# Patient Record
Sex: Male | Born: 1945 | Race: White | Hispanic: No | Smoking: Never smoker
Health system: Southern US, Community
[De-identification: ages and names within clinical notes are randomized; demographics above are authoritative.]

## PROBLEM LIST (undated history)

## (undated) DIAGNOSIS — R3915 Urgency of urination: Secondary | ICD-10-CM

## (undated) DIAGNOSIS — M549 Dorsalgia, unspecified: Secondary | ICD-10-CM

## (undated) DIAGNOSIS — G20 Parkinson's disease: Secondary | ICD-10-CM

## (undated) DIAGNOSIS — I10 Essential (primary) hypertension: Secondary | ICD-10-CM

## (undated) DIAGNOSIS — E119 Type 2 diabetes mellitus without complications: Secondary | ICD-10-CM

## (undated) DIAGNOSIS — M2040 Other hammer toe(s) (acquired), unspecified foot: Secondary | ICD-10-CM

## (undated) DIAGNOSIS — N200 Calculus of kidney: Secondary | ICD-10-CM

## (undated) DIAGNOSIS — G20A1 Parkinson's disease without dyskinesia, without mention of fluctuations: Secondary | ICD-10-CM

## (undated) HISTORY — DX: Essential (primary) hypertension: I10

## (undated) HISTORY — DX: Type 2 diabetes mellitus without complications: E11.9

## (undated) HISTORY — DX: Parkinson's disease: G20

## (undated) HISTORY — DX: Other hammer toe(s) (acquired), unspecified foot: M20.40

## (undated) HISTORY — DX: Dorsalgia, unspecified: M54.9

## (undated) HISTORY — DX: Urgency of urination: R39.15

## (undated) HISTORY — PX: TOE SURGERY: SHX1073

## (undated) HISTORY — DX: Parkinson's disease without dyskinesia, without mention of fluctuations: G20.A1

---

## 2000-02-08 ENCOUNTER — Encounter (INDEPENDENT_AMBULATORY_CARE_PROVIDER_SITE_OTHER): Payer: Self-pay | Admitting: Specialist

## 2000-02-08 ENCOUNTER — Ambulatory Visit (HOSPITAL_COMMUNITY): Admission: RE | Admit: 2000-02-08 | Discharge: 2000-02-08 | Payer: Self-pay | Admitting: Gastroenterology

## 2000-09-29 ENCOUNTER — Encounter: Admission: RE | Admit: 2000-09-29 | Discharge: 2000-12-28 | Payer: Self-pay | Admitting: Family Medicine

## 2001-01-15 ENCOUNTER — Encounter: Admission: RE | Admit: 2001-01-15 | Discharge: 2001-04-15 | Payer: Self-pay | Admitting: Family Medicine

## 2005-09-03 ENCOUNTER — Encounter: Admission: RE | Admit: 2005-09-03 | Discharge: 2005-09-03 | Payer: Self-pay | Admitting: Family Medicine

## 2009-04-14 ENCOUNTER — Emergency Department (HOSPITAL_COMMUNITY): Admission: EM | Admit: 2009-04-14 | Discharge: 2009-04-14 | Payer: Self-pay | Admitting: Emergency Medicine

## 2010-10-01 ENCOUNTER — Encounter
Admission: RE | Admit: 2010-10-01 | Discharge: 2010-10-01 | Payer: Self-pay | Source: Home / Self Care | Attending: Family Medicine | Admitting: Family Medicine

## 2011-01-28 LAB — DIFFERENTIAL
Basophils Absolute: 0 10*3/uL (ref 0.0–0.1)
Basophils Relative: 0 % (ref 0–1)
Eosinophils Absolute: 0.1 10*3/uL (ref 0.0–0.7)
Eosinophils Relative: 1 % (ref 0–5)
Lymphocytes Relative: 15 % (ref 12–46)
Lymphs Abs: 1.3 10*3/uL (ref 0.7–4.0)
Monocytes Absolute: 0.7 10*3/uL (ref 0.1–1.0)
Monocytes Relative: 8 % (ref 3–12)
Neutro Abs: 6.9 10*3/uL (ref 1.7–7.7)
Neutrophils Relative %: 76 % (ref 43–77)

## 2011-01-28 LAB — COMPREHENSIVE METABOLIC PANEL
ALT: <8 U/L (ref 0–53)
AST: 28 U/L (ref 0–37)
Albumin: 4.3 g/dL (ref 3.5–5.2)
Alkaline Phosphatase: 52 U/L (ref 39–117)
BUN: 22 mg/dL (ref 6–23)
CO2: 27 mEq/L (ref 19–32)
Calcium: 9.5 mg/dL (ref 8.4–10.5)
Chloride: 102 mEq/L (ref 96–112)
Creatinine, Ser: 0.93 mg/dL (ref 0.4–1.5)
GFR calc Af Amer: >60 mL/min (ref >60)
GFR calc non Af Amer: >60 mL/min (ref >60)
Glucose, Bld: 121 mg/dL — ABNORMAL HIGH (ref 70–99)
Potassium: 3.8 mEq/L (ref 3.5–5.1)
Sodium: 138 mEq/L (ref 135–145)
Total Bilirubin: 1.2 mg/dL (ref 0.3–1.2)
Total Protein: 7.1 g/dL (ref 6.0–8.3)

## 2011-01-28 LAB — CBC
HCT: 42.2 % (ref 39.0–52.0)
Hemoglobin: 14.8 g/dL (ref 13.0–17.0)
MCHC: 35 g/dL (ref 30.0–36.0)
MCV: 89.3 fL (ref 78.0–100.0)
Platelets: 185 10*3/uL (ref 150–400)
RBC: 4.73 MIL/uL (ref 4.22–5.81)
RDW: 13.2 % (ref 11.5–15.5)
WBC: 9.1 10*3/uL (ref 4.0–10.5)

## 2011-03-08 NOTE — Procedures (Signed)
[place]. [facility]  Patient:    Cody [name], CLASS                       MRN: [id number] Proc. Date: 02/08/00 Attending:  Florencia Reasons, M.D. CC:         Jethro Bastos, M.D.                           Procedure Report  PROCEDURE:  Colonoscopy with biopsies.  INDICATION:  A 65 year old gentleman with recent onset small volume hematochezia and nondiagnostic flexible sigmoidoscopy in the office.  FINDINGS:  Segmental colitis near the splenic flexure consistent with ischemia,  mild to moderate severity.  DESCRIPTION OF PROCEDURE:  The nature, purpose, and risks of the procedure had een discussed with the patient who provided written consent.  Sedation was fentanyl 100 mcg and Versed 10 mg IV without arrhythmias or desaturation.  The Olympus adjustable tension adult video colonoscope was advanced easily to the terminal ileum which had a normal appearance and pull back was then performed.  The only abnormality identified on this exam was a 10 cm segment of inflamed colonic mucosa near the splenic flexure extending from about 40 cm to 50 cm, as  measured during pull back.  This area had the characteristic appearance of ischemic colitis, characterized by semicircumferential and circumferential areas of inflammatory changes, including erythema, edema, and exudate.  The tissue appeared viable, not at all gangrenous, or frankly necrotic.  At the fringes of inflamed segment, the changes were more mild and almost linear in extent as opposed to circumferential where it is in the middle of the segment.  They were more pronounced and more circumferential, again consistent with ischemic colitis.  The inflamed areas were biopsied.  Otherwise this was a normal exam.  I did not see any polyps, cancer, vascular malformations, or diverticular disease.  The patient tolerated this procedure well and there were no apparent complications.  IMPRESSION:   Short segment of probable ischemic colitis, mild to moderate severity, pathology pending.  PLAN:  Await pathology on todays biopsies.  Since the patients bleeding has stopped and he is fully ambulatory and feels quite well, there is no need for inpatient management.  This can be followed expectantly. DD:  02/08/00 TD:  02/10/00 Job: [id number]

## 2013-01-18 ENCOUNTER — Telehealth: Payer: Self-pay | Admitting: *Deleted

## 2013-01-18 NOTE — Telephone Encounter (Signed)
Patient wants sooner appointment.

## 2013-01-19 ENCOUNTER — Encounter: Payer: Self-pay | Admitting: *Deleted

## 2013-01-21 NOTE — Telephone Encounter (Signed)
Patient coming for a sooner apt to see Dr.Yan 01/22/2013

## 2013-01-21 NOTE — Telephone Encounter (Signed)
error 

## 2013-01-22 ENCOUNTER — Ambulatory Visit (INDEPENDENT_AMBULATORY_CARE_PROVIDER_SITE_OTHER): Payer: Medicare Other | Admitting: Neurology

## 2013-01-22 ENCOUNTER — Encounter: Payer: Self-pay | Admitting: Neurology

## 2013-01-22 VITALS — BP 120/68 | HR 62 | Ht 71.0 in | Wt 218.0 lb

## 2013-01-22 DIAGNOSIS — M549 Dorsalgia, unspecified: Secondary | ICD-10-CM | POA: Insufficient documentation

## 2013-01-22 DIAGNOSIS — E119 Type 2 diabetes mellitus without complications: Secondary | ICD-10-CM

## 2013-01-22 DIAGNOSIS — M2040 Other hammer toe(s) (acquired), unspecified foot: Secondary | ICD-10-CM | POA: Insufficient documentation

## 2013-01-22 DIAGNOSIS — I10 Essential (primary) hypertension: Secondary | ICD-10-CM

## 2013-01-22 DIAGNOSIS — R3915 Urgency of urination: Secondary | ICD-10-CM

## 2013-01-22 DIAGNOSIS — M2042 Other hammer toe(s) (acquired), left foot: Secondary | ICD-10-CM

## 2013-01-22 DIAGNOSIS — G20 Parkinson's disease: Secondary | ICD-10-CM

## 2013-01-22 MED ORDER — ROPINIROLE HCL ER 4 MG PO TB24: 12.0000 mg | ORAL_TABLET | Freq: Every day | ORAL | Status: DC

## 2013-01-22 MED ORDER — CARBIDOPA-LEVODOPA-ENTACAPONE 50-200-200 MG PO TABS: 1.0000 | ORAL_TABLET | Freq: Four times a day (QID) | ORAL | Status: DC

## 2013-01-22 MED ORDER — RASAGILINE MESYLATE 1 MG PO TABS: 1.0000 mg | ORAL_TABLET | Freq: Every day | ORAL | Status: DC

## 2013-01-22 NOTE — Progress Notes (Signed)
Cody [name] is a 67 year old gentleman with Parkinson's disease, he was followed at [facility] since November 2005, by Dr. Orlin Hilding  for Parkinson's disease.   He has past medical history of diabetes, hypertension, prostate hypertrophy, depression/anxiety.   The initial symptoms was in 2003, he noticed dragging his left leg, while ambulating. Couple years later, he noticed a left hand resting tremor, stiffness of left upper extremity. He was diagnosed since 2005   Since seen by Dr. Orlin Hilding,  he was given Requip, which is causing sleepiness, has been on Stalevo , also taking amantadine 100 mg at 6:30 AM, 2:30 PM.  He described over all the meds works well,   He is currently living alone, driving, independent living, taking Stalevo 204 times a day, and 6, 10, 1400, 1800 every day, he gets up at 5:00, go to bed around 10 PM, sleeping in recliner sometimes, because of difficulty turning at that time, he is also taking Requip generic,4 mg  instead of 3 times a day, he is only taking one tablet every night. He is also taking Azilect 1 mg every night tolerating the medicine well, no significant side effect,   He reported wearing off today, 15-30 minutes before the next dose of Stalevo, he noticed stiffness, difficulty moving, bradykinesia, resolved in 30-60 minutes after he took next dose of medication, no significant side effect,  He was able to exercise regularly,  Review of Systems  Out of a complete 14 system review, the patient complains of only the following symptoms, and all other reviewed systems are negative.   Constitutional:   Fatigue Cardiovascular:  N/A Ear/Nose/Throat:  N/A Skin: N/A Eyes: N/A Respiratory: N/A Gastroitestinal: N/A    Hematology/Lymphatic:  Easy bleeding Endocrine:  N/A Musculoskeletal:N/A Allergy/Immunology: N/A Neurological: Weakness, restless leg  Psychiatric:    Depression, not enough sleep, change in appetite,   Physical Exam  General:  mild masking of face,  microphonia. Head: head  normocephalic and atraumatic.  Oropharynx benign. Neck: supple with no carotid or supraclavicular bruits. Restriction right and left Respiratory: LCA Cardiovascular: regular rate and rhythm, no murmurs  Neurologic Exam  Mental Status: Awake and fully alert. F  Mood and affect appropriate. Cranial Nerves: Pupils equal, briskly reactive to light.  Extraocular movements full without nystagmus.  Visual fields full to confrontation. Decreased blink noted.  Hearing intact and symmetric to finger snap.  Facial sensation intact.  Face, tongue, palate move normally and symmetrically.  Neck flexion and extension normal. Myerson sign neg.  Motor:  Normal strength in all tested extremity muscles. Moderate cogwheeling of limb and nuchal, bradykinesia of bilateral upper and lower extremity Sensory: normal to light touch. Coordination: no dysmetria, Finger-to-nose and heel-to-shin performed accurately bilaterally. Gait and Station: Arises from chair without use of hands, no difficulty.  Stance is narrow based.  mildly decreased arm swings, left worse than right mild difficulty turning, limp, scoliosis Reflexes: 2+ and symmetric.  Toes downgoing.   Assessment and Plan: 67 years old Caucasian male with past medical history of hypertension diabetes, with idiopathic Parkinson's disease,  Continue Stalevo at current dose, at 6, 10, 1400, 1800, Requip xr 4mg  titrating to 12mg  qhas Azilect 1mg  po qday  Moderate exercise, return to clinic in 4 months, if he continued to have wearing off phenomenon, I would increase his Stalevo to 5 tablets each day

## 2013-02-04 ENCOUNTER — Ambulatory Visit: Payer: Self-pay | Admitting: Nurse Practitioner

## 2013-02-23 ENCOUNTER — Telehealth: Payer: Self-pay | Admitting: *Deleted

## 2013-02-23 NOTE — Telephone Encounter (Signed)
R/S time 2:30 per patient

## 2013-03-01 ENCOUNTER — Telehealth: Payer: Self-pay | Admitting: *Deleted

## 2013-03-01 NOTE — Telephone Encounter (Signed)
Left message for patient appointment. 

## 2013-05-25 ENCOUNTER — Ambulatory Visit: Payer: Medicare Other | Admitting: Neurology

## 2013-06-09 ENCOUNTER — Ambulatory Visit: Payer: Self-pay | Admitting: Neurology

## 2013-06-09 ENCOUNTER — Ambulatory Visit: Payer: Medicare Other | Admitting: Neurology

## 2013-06-28 ENCOUNTER — Ambulatory Visit: Payer: Self-pay | Admitting: Neurology

## 2014-01-02 ENCOUNTER — Inpatient Hospital Stay (HOSPITAL_BASED_OUTPATIENT_CLINIC_OR_DEPARTMENT_OTHER)
Admission: EM | Admit: 2014-01-02 | Discharge: 2014-01-05 | DRG: 194 | Disposition: A | Discharge status: Home Health | Payer: Medicare HMO | Attending: Internal Medicine | Admitting: Internal Medicine

## 2014-01-02 ENCOUNTER — Emergency Department (HOSPITAL_BASED_OUTPATIENT_CLINIC_OR_DEPARTMENT_OTHER): Payer: Medicare HMO

## 2014-01-02 ENCOUNTER — Encounter (HOSPITAL_BASED_OUTPATIENT_CLINIC_OR_DEPARTMENT_OTHER): Payer: Self-pay | Admitting: Emergency Medicine

## 2014-01-02 DIAGNOSIS — J189 Pneumonia, unspecified organism: Principal | ICD-10-CM | POA: Diagnosis present

## 2014-01-02 DIAGNOSIS — E119 Type 2 diabetes mellitus without complications: Secondary | ICD-10-CM | POA: Diagnosis present

## 2014-01-02 DIAGNOSIS — B9689 Other specified bacterial agents as the cause of diseases classified elsewhere: Secondary | ICD-10-CM | POA: Diagnosis not present

## 2014-01-02 DIAGNOSIS — I959 Hypotension, unspecified: Secondary | ICD-10-CM | POA: Diagnosis present

## 2014-01-02 DIAGNOSIS — I10 Essential (primary) hypertension: Secondary | ICD-10-CM | POA: Diagnosis present

## 2014-01-02 DIAGNOSIS — Z888 Allergy status to other drugs, medicaments and biological substances status: Secondary | ICD-10-CM

## 2014-01-02 DIAGNOSIS — N390 Urinary tract infection, site not specified: Secondary | ICD-10-CM | POA: Diagnosis present

## 2014-01-02 DIAGNOSIS — G20 Parkinson's disease: Secondary | ICD-10-CM | POA: Diagnosis present

## 2014-01-02 DIAGNOSIS — R7881 Bacteremia: Secondary | ICD-10-CM | POA: Diagnosis not present

## 2014-01-02 DIAGNOSIS — Z881 Allergy status to other antibiotic agents status: Secondary | ICD-10-CM

## 2014-01-02 DIAGNOSIS — E871 Hypo-osmolality and hyponatremia: Secondary | ICD-10-CM | POA: Diagnosis not present

## 2014-01-02 DIAGNOSIS — G20A1 Parkinson's disease without dyskinesia, without mention of fluctuations: Secondary | ICD-10-CM | POA: Diagnosis present

## 2014-01-02 DIAGNOSIS — Z833 Family history of diabetes mellitus: Secondary | ICD-10-CM

## 2014-01-02 DIAGNOSIS — Z823 Family history of stroke: Secondary | ICD-10-CM

## 2014-01-02 DIAGNOSIS — M549 Dorsalgia, unspecified: Secondary | ICD-10-CM

## 2014-01-02 LAB — BASIC METABOLIC PANEL
BUN: 26 mg/dL — ABNORMAL HIGH (ref 6–23)
CO2: 24 mEq/L (ref 19–32)
Calcium: 9.6 mg/dL (ref 8.4–10.5)
Chloride: 94 mEq/L — ABNORMAL LOW (ref 96–112)
Creatinine, Ser: 1.1 mg/dL (ref 0.50–1.35)
GFR calc Af Amer: 78 mL/min — ABNORMAL LOW (ref >90)
GFR calc non Af Amer: 67 mL/min — ABNORMAL LOW (ref >90)
Glucose, Bld: 224 mg/dL — ABNORMAL HIGH (ref 70–99)
Potassium: 4 mEq/L (ref 3.7–5.3)
Sodium: 132 mEq/L — ABNORMAL LOW (ref 137–147)

## 2014-01-02 LAB — CBC
HCT: 40.4 % (ref 39.0–52.0)
Hemoglobin: 14.1 g/dL (ref 13.0–17.0)
MCH: 30.7 pg (ref 26.0–34.0)
MCHC: 34.9 g/dL (ref 30.0–36.0)
MCV: 88 fL (ref 78.0–100.0)
Platelets: 167 10*3/uL (ref 150–400)
RBC: 4.59 MIL/uL (ref 4.22–5.81)
RDW: 13.3 % (ref 11.5–15.5)
WBC: 14.8 10*3/uL — ABNORMAL HIGH (ref 4.0–10.5)

## 2014-01-02 MED ORDER — DEXTROSE 5 % IV SOLN
500.0000 mg | Freq: Once | INTRAVENOUS | Status: AC
  Administered 2014-01-03: 500 mg via INTRAVENOUS

## 2014-01-02 MED ORDER — DEXTROSE 5 % IV SOLN
1.0000 g | Freq: Once | INTRAVENOUS | Status: AC
  Administered 2014-01-02: 1 g via INTRAVENOUS

## 2014-01-02 MED ORDER — SODIUM CHLORIDE 0.9 % IV BOLUS (SEPSIS)
500.0000 mL | Freq: Once | INTRAVENOUS | Status: AC
  Administered 2014-01-02: 500 mL via INTRAVENOUS

## 2014-01-02 MED ORDER — CEFTRIAXONE SODIUM 1 G IJ SOLR
INTRAMUSCULAR | Status: AC
  Filled 2014-01-02: qty 10

## 2014-01-02 MED ORDER — SODIUM CHLORIDE 0.9 % IV BOLUS (SEPSIS): 1000.0000 mL | Freq: Once | INTRAVENOUS | Status: DC

## 2014-01-02 NOTE — ED Provider Notes (Addendum)
CSN: [id number]     Arrival date & time 01/02/14  2057 History   First MD Initiated Contact with Patient 01/02/14 2132     This chart was scribed for Dagmar Hait, MD by Arlan Organ, ED Scribe. This patient was seen in room MH04/MH04 and the patient's care was started 9:52 PM.   Chief Complaint  Patient presents with  . Tremors  . Shoulder Pain   HPI Comments: Worsening Parkinsonism - no missed meds or med changes  Patient is a 68 y.o. male presenting with weakness. The history is provided by the patient. No language interpreter was used.  Weakness This is a new problem. The current episode started 2 days ago. The problem occurs constantly. The problem has been gradually worsening. Pertinent negatives include no chest pain. Nothing aggravates the symptoms. Nothing relieves the symptoms.    HPI Comments:  is a 68 y.o. male with a PMHx of Parkinson's Disease and DM who presents to the Emergency Department complaining of tremors x 3 days that is progressively worsening. He reports a "stiff" feeling to all extremities, urinary incontinence, and 1 episode of vomiting. Pt reports a reading of 140-160 blood sugar today. Denies changing any medications, and states he has been taking his prescribed medications as directed. At this time he denies any fever, CP, trouble breathing, chills, diarrhea, leg pain, or abdominal pain. He denies any recent falls. No other concerns at this time.   Past Medical History  Diagnosis Date  . Back pain     lumbar  . Urgency of urination   . Hammer toe   . Diabetes mellitus   . Unspecified essential hypertension   . Parkinson disease    Past Surgical History  Procedure Laterality Date  . Toe surgery Right     big toe  . Toe surgery Left     2nd toe   Family History  Problem Relation Age of Onset  . High blood pressure Mother   . Diabetes Mother   . Stroke Father    History  Substance Use Topics  . Smoking status: Never Smoker   . Smokeless  tobacco: Not on file  . Alcohol Use: No    Review of Systems  Constitutional: Negative for fever and chills.  HENT: Negative for congestion.   Eyes: Negative for redness.  Respiratory: Negative for cough.   Cardiovascular: Negative for chest pain.  Gastrointestinal: Positive for vomiting. Negative for nausea and diarrhea.  Musculoskeletal: Positive for myalgias.  Skin: Negative for rash.  Neurological: Positive for tremors and weakness.  Psychiatric/Behavioral: Negative for confusion.      Allergies  Amoxapine and related and Amoxicillin  Home Medications   Current Outpatient Rx  Name  Route  Sig  Dispense  Refill  . atenolol (TENORMIN) 25 MG tablet   Oral   Take 25 mg by mouth daily.         . carbidopa-levodopa-entacapone (STALEVO) 50-200-200 MG per tablet   Oral   Take 1 tablet by mouth 4 (four) times daily. At 6, 10, 1400, 1800 pm   130 tablet   6   . lisinopril-hydrochlorothiazide (PRINZIDE,ZESTORETIC) 10-12.5 MG per tablet   Oral   Take 1 tablet by mouth daily.         . metFORMIN (GLUCOPHAGE) 500 MG tablet   Oral   Take 500 mg by mouth 2 (two) times daily with a meal.         . rasagiline (AZILECT) 1 MG TABS  Oral   Take 1 tablet (1 mg total) by mouth daily.   30 tablet   6   . rOPINIRole (REQUIP XL) 4 MG 24 hr tablet   Oral   Take 3 tablets (12 mg total) by mouth at bedtime.   90 tablet   6   . sertraline (ZOLOFT) 100 MG tablet   Oral   Take 100 mg by mouth daily.           Triage Vitals: BP 104/60  Pulse 100  Temp(Src) 98.1 F (36.7 C) (Oral)  Resp 24  Ht 5\' 10"  (1.778 m)  Wt 221 lb (100.245 kg)  BMI 31.71 kg/m2  SpO2 95%   Physical Exam  Nursing note and vitals reviewed. Constitutional: He is oriented to person, place, and time. He appears well-developed and well-nourished.  HENT:  Head: Normocephalic and atraumatic.  Eyes: EOM are normal.  Neck: Normal range of motion.  Cardiovascular: Normal rate, regular rhythm,  normal heart sounds and intact distal pulses.   Pulmonary/Chest: Effort normal and breath sounds normal. No respiratory distress.  Abdominal: Soft. He exhibits no distension. There is no tenderness.  Musculoskeletal: Normal range of motion.  Mild cogwheel rigidity of right shoulder  Neurological: He is alert and oriented to person, place, and time.  Skin: Skin is warm and dry.  Psychiatric: He has a normal mood and affect. Judgment normal.    ED Course  Procedures (including critical care time)  DIAGNOSTIC STUDIES: Oxygen Saturation is 95% on RA, adequate by my interpretation.    COORDINATION OF CARE: 10:02 PM- Will order CBC, basic metabolic panel, and urinalysis. Discussed treatment plan with pt at bedside and pt agreed to plan.     Labs Review Labs Reviewed  CBC - Abnormal; Notable for the following:    WBC 14.8 (*)    All other components within normal limits  BASIC METABOLIC PANEL - Abnormal; Notable for the following:    Sodium 132 (*)    Chloride 94 (*)    Glucose, Bld 224 (*)    BUN 26 (*)    GFR calc non Af Amer 67 (*)    GFR calc Af Amer 78 (*)    All other components within normal limits  CULTURE, BLOOD (ROUTINE X 2)  CULTURE, BLOOD (ROUTINE X 2)  URINALYSIS, ROUTINE W REFLEX MICROSCOPIC   Imaging Review Dg Chest 2 View  01/02/2014   CLINICAL DATA:  Tremors, shoulder pain  EXAM: CHEST  2 VIEW  COMPARISON:  None.  FINDINGS: The cardiac and mediastinal silhouettes are within normal limits.  Lungs are well inflated. There is asymmetric patchy opacity within the left lower lobe, which may reflect a developing infectious pneumonitis. Aspiration could also be considered. No other focal infiltrate. No pulmonary edema or pleural effusion. No pneumothorax.  The osseous structures are within normal limits.  IMPRESSION: Patchy left lower lobe opacity. While this finding may reflect atelectasis or summation of shadows, possible infectious or aspiration pneumonitis could also be  considered in the correct clinical setting.   Electronically Signed   By: Rise MuBenjamin  McClintock M.D.   On: 01/02/2014 22:57     EKG Interpretation None      MDM   Final diagnoses:  Community acquired pneumonia  Parkinson disease    10M with hx of Parkinsonism complaining of worsening Parkinsonism. Difficulty with ambulation, moving arms. No fevers, no SOB. No missed meds or change in meds. Exam with cogwheel rigidity of R arm/shoulder.  Labs show leukocytosis,  CXR shows LLL PNA. Given Rocephin/Azithro after cultures drawn. Admitted to medicine at [facility].  I have reviewed all labs and imaging and considered them in my medical decision making.  I personally performed the services described in this documentation, which was scribed in my presence. The recorded information has been reviewed and is accurate.     Dagmar Hait, MD 01/03/14 0011  Dagmar Hait, MD 01/03/14 [phone]

## 2014-01-02 NOTE — ED Notes (Signed)
Was asked to respond to the room to assist with bathroom needs.  On walking into the room, the son in law at bedside states 'its too late, he couldn't wait.'  Patient had urinated on himself and the bed.  The son in law was laughing and states 'now you have a mess to clean up.'  I asked what had happened he and verbalized the doctor (with the orange oakley glasses) and the girl with the computer had been in the room and they had asked for assistance with bathroom needs.  He stated the doctor had told him that someone would be in.  This was never passed along to the attending RN.  I provided the patient with wash cloths, towels, and a clean gown to change into.  The bed linens were changed and fresh linens provided.  Patient returned to bed, warm blankets given.  A urinal is at bedside and they will notify us immediately of any toileting needs.  Chanin, charge RN was aware of this incident.

## 2014-01-02 NOTE — ED Notes (Signed)
Pt reports his Parkinsons has worsened today - states it began to worsen Thursday - denies change in medication regimen, states he has taken medications as prescribed - reports increased tremors and right shoulder pain.

## 2014-01-02 NOTE — ED Notes (Signed)
Patient provided something to drink/crackers on family request.

## 2014-01-03 DIAGNOSIS — I10 Essential (primary) hypertension: Secondary | ICD-10-CM

## 2014-01-03 DIAGNOSIS — E119 Type 2 diabetes mellitus without complications: Secondary | ICD-10-CM

## 2014-01-03 DIAGNOSIS — J189 Pneumonia, unspecified organism: Secondary | ICD-10-CM | POA: Diagnosis present

## 2014-01-03 DIAGNOSIS — G20 Parkinson's disease: Secondary | ICD-10-CM

## 2014-01-03 LAB — CBC WITH DIFFERENTIAL/PLATELET
BASOS PCT: 0 % (ref 0–1)
Basophils Absolute: 0 10*3/uL (ref 0.0–0.1)
EOS ABS: 0 10*3/uL (ref 0.0–0.7)
EOS PCT: 0 % (ref 0–5)
HEMATOCRIT: 40.7 % (ref 39.0–52.0)
Hemoglobin: 14.2 g/dL (ref 13.0–17.0)
Lymphocytes Relative: 9 % — ABNORMAL LOW (ref 12–46)
Lymphs Abs: 1 10*3/uL (ref 0.7–4.0)
MCH: 30.3 pg (ref 26.0–34.0)
MCHC: 34.9 g/dL (ref 30.0–36.0)
MCV: 86.8 fL (ref 78.0–100.0)
MONO ABS: 1.2 10*3/uL — AB (ref 0.1–1.0)
MONOS PCT: 10 % (ref 3–12)
NEUTROS ABS: 9.6 10*3/uL — AB (ref 1.7–7.7)
Neutrophils Relative %: 81 % — ABNORMAL HIGH (ref 43–77)
Platelets: 153 10*3/uL (ref 150–400)
RBC: 4.69 MIL/uL (ref 4.22–5.81)
RDW: 13.5 % (ref 11.5–15.5)
WBC: 11.8 10*3/uL — ABNORMAL HIGH (ref 4.0–10.5)

## 2014-01-03 LAB — GLUCOSE, CAPILLARY
GLUCOSE-CAPILLARY: 210 mg/dL — AB (ref 70–99)
GLUCOSE-CAPILLARY: 190 mg/dL — AB (ref 70–99)
GLUCOSE-CAPILLARY: 184 mg/dL — AB (ref 70–99)
Glucose-Capillary: 238 mg/dL — ABNORMAL HIGH (ref 70–99)
Glucose-Capillary: 143 mg/dL — ABNORMAL HIGH (ref 70–99)
Glucose-Capillary: 156 mg/dL — ABNORMAL HIGH (ref 70–99)

## 2014-01-03 LAB — URINE MICROSCOPIC-ADD ON

## 2014-01-03 LAB — COMPREHENSIVE METABOLIC PANEL
ALBUMIN: 3.6 g/dL (ref 3.5–5.2)
ALT: 28 U/L (ref 0–53)
AST: 20 U/L (ref 0–37)
Alkaline Phosphatase: 54 U/L (ref 39–117)
BILIRUBIN TOTAL: 1.4 mg/dL — AB (ref 0.3–1.2)
BUN: 19 mg/dL (ref 6–23)
CALCIUM: 9.1 mg/dL (ref 8.4–10.5)
CHLORIDE: 92 meq/L — AB (ref 96–112)
CO2: 22 mEq/L (ref 19–32)
Creatinine, Ser: 0.9 mg/dL (ref 0.50–1.35)
GFR calc Af Amer: >90 mL/min (ref >90)
GFR calc non Af Amer: 85 mL/min — ABNORMAL LOW (ref >90)
Glucose, Bld: 191 mg/dL — ABNORMAL HIGH (ref 70–99)
Potassium: 3.6 mEq/L — ABNORMAL LOW (ref 3.7–5.3)
Sodium: 130 mEq/L — ABNORMAL LOW (ref 137–147)
Total Protein: 6.8 g/dL (ref 6.0–8.3)

## 2014-01-03 LAB — URINALYSIS, ROUTINE W REFLEX MICROSCOPIC
Bilirubin Urine: NEGATIVE
Glucose, UA: NEGATIVE mg/dL
Ketones, ur: 15 mg/dL — AB
Nitrite: POSITIVE — AB
Protein, ur: NEGATIVE mg/dL
Specific Gravity, Urine: 1.019 (ref 1.005–1.030)
Urobilinogen, UA: 0.2 mg/dL (ref 0.0–1.0)
pH: 5.5 (ref 5.0–8.0)

## 2014-01-03 LAB — LEGIONELLA ANTIGEN, URINE: LEGIONELLA ANTIGEN, URINE: NEGATIVE

## 2014-01-03 LAB — STREP PNEUMONIAE URINARY ANTIGEN: Strep Pneumo Urinary Antigen: NEGATIVE

## 2014-01-03 LAB — GRAM STAIN

## 2014-01-03 MED ORDER — CARBIDOPA-LEVODOPA ER 50-200 MG PO TBCR
1.0000 | EXTENDED_RELEASE_TABLET | ORAL | Status: DC
  Administered 2014-01-03 – 2014-01-05 (×11): 1 via ORAL
  Filled 2014-01-03 (×13): qty 1

## 2014-01-03 MED ORDER — RASAGILINE MESYLATE 1 MG PO TABS
1.0000 mg | ORAL_TABLET | Freq: Every day | ORAL | Status: DC
  Administered 2014-01-03 – 2014-01-05 (×3): 1 mg via ORAL
  Filled 2014-01-03 (×3): qty 1

## 2014-01-03 MED ORDER — ROPINIROLE HCL 1 MG PO TABS
4.0000 mg | ORAL_TABLET | Freq: Three times a day (TID) | ORAL | Status: DC
  Administered 2014-01-03 – 2014-01-05 (×7): 4 mg via ORAL
  Filled 2014-01-03 (×10): qty 4

## 2014-01-03 MED ORDER — CARBIDOPA-LEVODOPA-ENTACAPONE 50-200-200 MG PO TABS: 1.0000 | ORAL_TABLET | Freq: Four times a day (QID) | ORAL | Status: DC

## 2014-01-03 MED ORDER — SERTRALINE HCL 100 MG PO TABS
100.0000 mg | ORAL_TABLET | Freq: Every day | ORAL | Status: DC
  Administered 2014-01-03 – 2014-01-05 (×3): 100 mg via ORAL
  Filled 2014-01-03 (×3): qty 1

## 2014-01-03 MED ORDER — ENOXAPARIN SODIUM 40 MG/0.4ML ~~LOC~~ SOLN
40.0000 mg | SUBCUTANEOUS | Status: DC
  Administered 2014-01-03 – 2014-01-05 (×3): 40 mg via SUBCUTANEOUS
  Filled 2014-01-03 (×3): qty 0.4

## 2014-01-03 MED ORDER — ENTACAPONE 200 MG PO TABS
200.0000 mg | ORAL_TABLET | ORAL | Status: DC
  Administered 2014-01-03 – 2014-01-05 (×11): 200 mg via ORAL
  Filled 2014-01-03 (×13): qty 1

## 2014-01-03 MED ORDER — ATENOLOL 25 MG PO TABS
25.0000 mg | ORAL_TABLET | Freq: Every day | ORAL | Status: DC
  Administered 2014-01-03 – 2014-01-05 (×3): 25 mg via ORAL
  Filled 2014-01-03 (×3): qty 1

## 2014-01-03 MED ORDER — INSULIN ASPART 100 UNIT/ML ~~LOC~~ SOLN: 0.0000 [IU] | Freq: Every day | SUBCUTANEOUS | Status: DC

## 2014-01-03 MED ORDER — DEXTROSE 5 % IV SOLN
1.0000 g | INTRAVENOUS | Status: DC
  Administered 2014-01-03: 1 g via INTRAVENOUS
  Filled 2014-01-03: qty 10

## 2014-01-03 MED ORDER — INSULIN ASPART 100 UNIT/ML ~~LOC~~ SOLN
0.0000 [IU] | Freq: Three times a day (TID) | SUBCUTANEOUS | Status: DC
  Administered 2014-01-03: 5 [IU] via SUBCUTANEOUS
  Administered 2014-01-03 (×2): 3 [IU] via SUBCUTANEOUS
  Administered 2014-01-04: 5 [IU] via SUBCUTANEOUS
  Administered 2014-01-04: 2 [IU] via SUBCUTANEOUS
  Administered 2014-01-04: 3 [IU] via SUBCUTANEOUS
  Administered 2014-01-05 (×2): 2 [IU] via SUBCUTANEOUS

## 2014-01-03 MED ORDER — DEXTROSE 5 % IV SOLN
500.0000 mg | INTRAVENOUS | Status: DC
  Administered 2014-01-04 – 2014-01-05 (×2): 500 mg via INTRAVENOUS
  Filled 2014-01-03 (×3): qty 500

## 2014-01-03 NOTE — ED Notes (Signed)
Back to patient's room to find patient out of bed, having urinated on the floor (urine running underneath the stretcher and the son in law had moved the stretcher).  States he was trying to use the urinal and did not aim well.  Assisted to clean up the floor and into a dry gown.

## 2014-01-03 NOTE — Progress Notes (Signed)
Pt A white male 68 yrs old [facility] . Pt c/o of Increase tremor Hx of DM HTN and Parkinson . Pt alert  Awake and oriented  X 4. Transferred to bed, made comfortable Rn ,introduced  Self to pt   & oriented to room and use of call light.  Initial assessment done. MD on call page for admission orders  Pt denied any pain during this assessment.

## 2014-01-03 NOTE — Progress Notes (Signed)
Utilization review completed.  

## 2014-01-03 NOTE — Consult Note (Signed)
PHARMACY NOTE  CONSULT :  Renal Adjustment of Antibiotic[s] INDICATION :  CAP  OBJECTIVE:  Pharmacy consulted for Renal adjustment of Antibiotic[s].   Patient is to receive Azithromycin and Ceftriaxone for 7 days for CAP.  Weight  100.8 kg ,  CrCl  80 ml/min  Ordered doses are appropriate and neither require any renal adjustments.  PLAN:  1. Continue Azithromycin and Ceftriaxone at current dose and schedules x 7 days. 2. Recommend follow up labs, UOP, cultures, clinical course, length of therapy, and adjust as required. 3. Pharmacy will sign off at this time.  Please re-consult if additional assistance is needed.  Thank you for allowing Pharmacy to participate in this patient's care.   Jaileen Janelle, Elisha HeadlandEarle J,  Pharm.D. ,  01/03/2014,  12:40 PM

## 2014-01-03 NOTE — H&P (Signed)
[facility] History and Physical  Patient: Cody [name]  ZOX:[id number]  DOB: May 04, 1946  DOS: the patient was seen and examined on 01/03/2014 PCP: Hollice EspyGATES,DONNA RUTH, MD  Chief Complaint: Generalized weakness  HPI: Javier DockerCarlas Obrecht is a 68 y.o. male with Past medical history of parkinsonism, diabetes, hypertension. The patient is coming from home. The patient presented with complaints of generalized weakness that has been ongoing since last 2 days. He mentions that his body is not able to find the parkinsonism despite continuing his medication. He also present in the same frequency of urine and incontinence occasionally denies any burning urination. He had one episode of vomiting per day. He denies any fever or chills denies any shortness of breath or chest pain. He has started having some cough with yellowish expectoration without any blood. He denies any diarrhea or constipation leg swelling or fall or trauma or syncopal episode. He continues to mention that he has been compliant with his medications.  Review of Systems: as mentioned in the history of present illness.  A Comprehensive review of the other systems is negative.  Past Medical History  Diagnosis Date  . Back pain     lumbar  . Urgency of urination   . Hammer toe   . Diabetes mellitus   . Unspecified essential hypertension   . Parkinson disease    Past Surgical History  Procedure Laterality Date  . Toe surgery Right     big toe  . Toe surgery Left     2nd toe   Social History:  reports that he has never smoked. He does not have any smokeless tobacco history on file. He reports that he does not drink alcohol or use illicit drugs. Independent for most of his  ADL.  Allergies  Allergen Reactions  . Amoxapine And Related Swelling  . Amoxicillin     Family History  Problem Relation Age of Onset  . High blood pressure Mother   . Diabetes Mother   . Stroke Father     Prior to Admission medications    Medication Sig Start Date End Date Taking? Authorizing Provider  atenolol (TENORMIN) 25 MG tablet Take 25 mg by mouth daily.   Yes Historical Provider, MD  carbidopa-levodopa-entacapone (STALEVO) 50-200-200 MG per tablet Take 1 tablet by mouth 4 (four) times daily. At 6, 10, 1400, 1800 pm 01/22/13  Yes Levert FeinsteinYijun Yan, MD  lisinopril-hydrochlorothiazide (PRINZIDE,ZESTORETIC) 10-12.5 MG per tablet Take 1 tablet by mouth daily.   Yes Historical Provider, MD  metFORMIN (GLUCOPHAGE) 500 MG tablet Take 500 mg by mouth 2 (two) times daily with a meal.   Yes Historical Provider, MD  rasagiline (AZILECT) 1 MG TABS Take 1 tablet (1 mg total) by mouth daily. 01/22/13  Yes Levert FeinsteinYijun Yan, MD  rOPINIRole (REQUIP XL) 4 MG 24 hr tablet Take 3 tablets (12 mg total) by mouth at bedtime. 01/22/13  Yes Levert FeinsteinYijun Yan, MD  sertraline (ZOLOFT) 100 MG tablet Take 100 mg by mouth daily.    Yes Historical Provider, MD    Physical Exam: Filed Vitals:   01/02/14 2114 01/02/14 2353 01/03/14 0148  BP: 104/60 114/59 119/74  Pulse: 100 93 91  Temp: 98.1 F (36.7 C) 99.6 F (37.6 C) 98.5 F (36.9 C)  TempSrc: Oral Oral Oral  Resp: 24  20  Height: 5\' 10"  (1.778 m)  5' 1.2" (1.554 m)  Weight: 100.245 kg (221 lb)  100.835 kg (222 lb 4.8 oz)  SpO2: 95% 93% 94%    General: Alert,  Awake and Oriented to Time, Place and Person. Appear in mild distress Eyes: PERRL ENT: Oral Mucosa clear moist. Neck: No JVD Cardiovascular: S1 and S2 Present, no Murmur, Peripheral Pulses Present Respiratory: Bilateral Air entry equal and Decreased, faint basal left Crackles, no wheezes Abdomen: Bowel Sound Present, Soft and Non tender Skin: No Rash Extremities: Trace Pedal edema, no calf tenderness Neurologic: Grossly Unremarkable. Mild rigidity  Labs on Admission:  CBC:  Recent Labs Lab 01/02/14 2220  WBC 14.8*  HGB 14.1  HCT 40.4  MCV 88.0  PLT 167    CMP     Component Value Date/Time   NA 132* 01/02/2014 2220   K 4.0 01/02/2014 2220   CL  94* 01/02/2014 2220   CO2 24 01/02/2014 2220   GLUCOSE 224* 01/02/2014 2220   BUN 26* 01/02/2014 2220   CREATININE 1.10 01/02/2014 2220   CALCIUM 9.6 01/02/2014 2220   PROT 7.1 04/14/2009 1527   ALBUMIN 4.3 04/14/2009 1527   AST 28 04/14/2009 1527   ALT <8 04/14/2009 1527   ALKPHOS 52 04/14/2009 1527   BILITOT 1.2 04/14/2009 1527   GFRNONAA 67* 01/02/2014 2220   GFRAA 78* 01/02/2014 2220    No results found for this basename: LIPASE, AMYLASE,  in the last 168 hours No results found for this basename: AMMONIA,  in the last 168 hours  No results found for this basename: CKTOTAL, CKMB, CKMBINDEX, TROPONINI,  in the last 168 hours BNP (last 3 results) No results found for this basename: PROBNP,  in the last 8760 hours  Radiological Exams on Admission: Dg Chest 2 View  01/02/2014   CLINICAL DATA:  Tremors, shoulder pain  EXAM: CHEST  2 VIEW  COMPARISON:  None.  FINDINGS: The cardiac and mediastinal silhouettes are within normal limits.  Lungs are well inflated. There is asymmetric patchy opacity within the left lower lobe, which may reflect a developing infectious pneumonitis. Aspiration could also be considered. No other focal infiltrate. No pulmonary edema or pleural effusion. No pneumothorax.  The osseous structures are within normal limits.  IMPRESSION: Patchy left lower lobe opacity. While this finding may reflect atelectasis or summation of shadows, possible infectious or aspiration pneumonitis could also be considered in the correct clinical setting.   Electronically Signed   By: Rise Mu M.D.   On: 01/02/2014 22:57    Assessment/Plan Active Problems:   Back pain   Diabetes mellitus   Unspecified essential hypertension   Parkinson's disease   CAP (community acquired pneumonia)   1. CAP (community acquired pneumonia) The patient is presenting with numbness of generalized weakness and cough with yellowish expectoration he does not have fever but he does have significant  leukocytosis and borderline hypotension. He has x-ray which is positive for left-sided infiltrate. Patient will be admitted to the hospital, I would continue him on IV ceftriaxone and azithromycin. Sputum cultures blood cultures urine antigens will be done.  2. Hypertension At present continue atenolol and holding lisinopril hydrochlorothiazide combination due to borderline hypotension  3. Parkinsonism Continuing carbidopa levodopa entacapone and rasagiline as well as requip  4. Diabetes mellitus Holding metformin and placing the patient on sliding scale  DVT Prophylaxis: subcutaneous Heparin Nutrition: Cardiac diet diabetic  Code Status: Full  Disposition: Admitted to inpatient in telemetry unit.  Author: Lynden Oxford, MD [facility] Pager: [phone] 01/03/2014, 3:01 AM    If 7PM-7AM, please contact night-coverage [email or web address] Password [email or web address]

## 2014-01-03 NOTE — Progress Notes (Signed)
Nutrition Brief Note  RD drawn to chart secondary to current prescription of azilect, a medication with potential interactions with high tyramine-containing foods.  Hospitalized diet is not high in tyramine and poses no risk for patient at this time.   No nutrition interventions warranted at this time. If additional nutrition issues arise or education warranted, please consult RD.  Jarold MottoSamantha Atom Solivan MS, RD, LDN Inpatient Registered Dietitian Pager: [phone] After-hours pager: [phone]

## 2014-01-03 NOTE — ED Notes (Signed)
Report given to [employer] Airlinesommy Hooks, [facility].  Pt was ambulated to BR on request prior to leaving.

## 2014-01-03 NOTE — Evaluation (Signed)
Physical Therapy Evaluation Patient Details Name: Cody [name] MRN: [id number] DOB: 1946/08/19 Today's Date: 01/03/2014 Time: [id number] PT Time Calculation (min): 23 min  PT Assessment / Plan / Recommendation History of Present Illness  Cody [name] is a 68 y.o. male with Past medical history of parkinsonism, diabetes, hypertension.  Presents to hospital with exacerbation of Parkinson's symptoms, CXR revealed CAP  Clinical Impression  Pt admitted with CAP. Pt currently with functional limitations due to the deficits listed below (see PT Problem List). Pt will benefit from skilled PT to increase their independence and safety with mobility to allow discharge to the venue listed below. Anticipate pt will be able to safely d/c home with HHPT and supervision from family and friends.    PT Assessment  Patient needs continued PT services    Follow Up Recommendations  Home health PT;Supervision/Assistance - 24 hour    Does the patient have the potential to tolerate intense rehabilitation      Barriers to Discharge Decreased caregiver support recommend 24 hour supervision intially-pt. states neighbors available    Equipment Recommendations  None recommended by PT    Recommendations for Other Services     Frequency Min 3X/week    Precautions / Restrictions Precautions Precautions: Fall Restrictions Weight Bearing Restrictions: No   Pertinent Vitals/Pain None reported      Mobility  Bed Mobility Overal bed mobility: Needs Assistance General bed mobility comments: pt. sitting EOB upon entering room; reports RN provided assistance for bed mobility Transfers Overall transfer level: Needs assistance Transfers: Sit to/from Stand;Stand Pivot Transfers Sit to Stand: Mod assist Stand pivot transfers: Mod assist General transfer comment: shuffling steps with decreased step length; multiple attempts to stand needing cues for feet placement and ant weight shift Ambulation/Gait Gait  Pattern/deviations: Shuffle    Exercises     PT Diagnosis: Difficulty walking;Abnormality of gait;Generalized weakness  PT Problem List: Decreased activity tolerance;Decreased balance;Decreased mobility;Decreased knowledge of use of DME;Decreased safety awareness;Decreased knowledge of precautions PT Treatment Interventions: DME instruction;Gait training;Functional mobility training;Therapeutic activities;Therapeutic exercise;Balance training;Neuromuscular re-education;Patient/family education     PT Goals(Current goals can be found in the care plan section) Acute Rehab PT Goals Patient Stated Goal: "I'm going home." PT Goal Formulation: With patient Time For Goal Achievement: 01/10/14 Potential to Achieve Goals: Good  Visit Information  Last PT Received On: 01/03/14 Assistance Needed: +1 History of Present Illness: Cody [name] is a 68 y.o. male with Past medical history of parkinsonism, diabetes, hypertension.  Presents to hospital with exacerbation of Parkinson's symptoms, CXR revealed CAP       Prior Functioning  Home Living Family/patient expects to be discharged to:: Private residence (independent living) Living Arrangements: Alone Available Help at Discharge: Family;Friend(s);Available PRN/intermittently Type of Home: Apartment Home Access: Elevator (pt. on 3rd floor) Home Layout: One level Home Equipment: Walker - 4 wheels Prior Function Level of Independence: Independent (only uses rollator to [employer]) Communication Communication: No difficulties (low voice volume)    Cognition  Cognition Arousal/Alertness: Awake/alert Behavior During Therapy: WFL for tasks assessed/performed Overall Cognitive Status: Within Functional Limits for tasks assessed    Extremity/Trunk Assessment Upper Extremity Assessment Upper Extremity Assessment: Overall WFL for tasks assessed Lower Extremity Assessment Lower Extremity Assessment: Generalized weakness Cervical / Trunk  Assessment Cervical / Trunk Assessment: Kyphotic   Balance Balance Overall balance assessment: Needs assistance Sitting-balance support: Feet supported;No upper extremity supported Sitting balance-Leahy Scale: Good Standing balance support: During functional activity;Bilateral upper extremity supported Standing balance-Leahy Scale: Poor  End of Session PT -  End of Session Equipment Utilized During Treatment: Gait belt Activity Tolerance: Patient tolerated treatment well Patient left: in chair;with call bell/phone within reach Nurse Communication: Mobility status  GP     Moshe Cipro K 01/03/2014, 9:33 AM  Clarita Crane, PT, DPT [phone]

## 2014-01-03 NOTE — Progress Notes (Signed)
Pt very spastic this am  r/t parkinson disease and  need extensive care with getting OOB to bedside commode. Stedy  Needed to get to Bedside commode. PT/ OT to evaluate and treat.

## 2014-01-03 NOTE — Progress Notes (Signed)
PROGRESS NOTE  Cody [name] ZOX:[id number] DOB: 19-May-1946 DOA: 01/02/2014 PCP: [name], MD  HPI/Subjective: 68 y.o. Male with history of parkinsonism, DM, HTN admitted yesterday with community acquired pneumonia and UTI. Pt is comfortable today with no complaints.   Assessment/Plan: CAP (community acquired pneumonia) - xray positive for left-sided infiltrate 3/15. Leukocytosis trending down.  -Started on empiric ceftriaxone and azithromycin. Sputum culture pending. -continue antibiotics. Monitor CBC.  UTI (Urinary tract infection) - UA showed + nitrite, large leukocytes, and many bacteria. Leukocytosis trending down. -Started on empiric ceftriaxone and azithromycin. -continue antibiotics. Monitor CBC.  Diabetes mellitus -stable. -hold metformin and continue SSI. -monitor CBGs.  Unspecified essential hypertension -stable. -hold lisinopril/HCTZ due to borderline hypotension. -continue atenolol.  Parkinson's disease -pt very stiff this am. -PT/OT evaluating. -continue carbidopa, levodopa, entacapone, requip, and rasagiline.   DVT Prophylaxis:  Heparin  Code Status: full Family Communication: patient states understanding, no family at bedside. Disposition Plan: remain inpatient. Discharge home with home health when medically appropriate.   Consultants:  none  Procedures:  none  Antibiotics: Anti-infectives   Start     Dose/Rate Route Frequency Ordered Stop   01/04/14 0000  azithromycin (ZITHROMAX) 500 mg in dextrose 5 % 250 mL IVPB     500 mg 250 mL/hr over 60 Minutes Intravenous Every 24 hours 01/03/14 0301 01/10/14 2359   01/03/14 2200  cefTRIAXone (ROCEPHIN) 1 g in dextrose 5 % 50 mL IVPB     1 g 100 mL/hr over 30 Minutes Intravenous Every 24 hours 01/03/14 0301 01/10/14 2159   01/02/14 2328  cefTRIAXone (ROCEPHIN) 1 G injection    Comments:  Aleene Davidson   : cabinet override      01/02/14 2328 01/03/14 1129   01/02/14 2315  cefTRIAXone  (ROCEPHIN) 1 g in dextrose 5 % 50 mL IVPB     1 g 100 mL/hr over 30 Minutes Intravenous  Once 01/02/14 2312 01/03/14 0021   01/02/14 2315  azithromycin (ZITHROMAX) 500 mg in dextrose 5 % 250 mL IVPB     500 mg 250 mL/hr over 60 Minutes Intravenous  Once 01/02/14 2312 01/03/14 0130      Objective: Filed Vitals:   01/02/14 2114 01/02/14 2353 01/03/14 0148 01/03/14 0519  BP: 104/60 114/59 119/74 146/75  Pulse: 100 93 91 78  Temp: 98.1 F (36.7 C) 99.6 F (37.6 C) 98.5 F (36.9 C) 99 F (37.2 C)  TempSrc: Oral Oral Oral Oral  Resp: 24  20 20   Height: 5\' 10"  (1.778 m)  5' 1.2" (1.554 m)   Weight: 100.245 kg (221 lb)  100.835 kg (222 lb 4.8 oz)   SpO2: 95% 93% 94% 93%    Intake/Output Summary (Last 24 hours) at 01/03/14 0944 Last data filed at 01/03/14 0521  Gross per 24 hour  Intake    240 ml  Output    600 ml  Net   -360 ml   Filed Weights   01/02/14 2114 01/03/14 0148  Weight: 100.245 kg (221 lb) 100.835 kg (222 lb 4.8 oz)    Exam: General: Well developed, well nourished, NAD, appears stated age, having trouble sitting up.  Cardiovascular: RRR, S1 S2 auscultated, no rubs, murmurs or gallops.   Respiratory: equal chest rise, mild crackles in left lower lobe, no wheezing.  Abdomen: Soft, nontender, nondistended, + bowel sounds  Extremities: warm dry without cyanosis clubbing or edema, no calf tenderness Neuro: AAOx3, Strength 4/5 in upper and lower extremities, mild rigidity  Skin: Without rashes exudates  or nodules.   Psych: Normal affect and demeanor with intact judgement and insight   Data Reviewed: Basic Metabolic Panel:  Recent Labs Lab 01/02/14 2220 01/03/14 0540  NA 132* 130*  K 4.0 3.6*  CL 94* 92*  CO2 24 22  GLUCOSE 224* 191*  BUN 26* 19  CREATININE 1.10 0.90  CALCIUM 9.6 9.1   Liver Function Tests:  Recent Labs Lab 01/03/14 0540  AST 20  ALT 28  ALKPHOS 54  BILITOT 1.4*  PROT 6.8  ALBUMIN 3.6   CBC:  Recent Labs Lab 01/02/14 2220  01/03/14 0540  WBC 14.8* 11.8*  NEUTROABS  --  9.6*  HGB 14.1 14.2  HCT 40.4 40.7  MCV 88.0 86.8  PLT 167 153   CBG:  Recent Labs Lab 01/03/14 0243 01/03/14 0814  GLUCAP 238* 210*    Recent Results (from the past 240 hour(s))  GRAM STAIN     Status: None   Collection Time    01/03/14  5:26 AM      Result Value Ref Range Status   Specimen Description URINE, CLEAN CATCH   Final   Special Requests NONE   Final   Gram Stain     Final   Value: CYTOSPIN SLIDE     SQUAMOUS EPITHELIAL CELLS PRESENT     WBC PRESENT, PREDOMINANTLY PMN     GRAM NEGATIVE RODS   Report Status 01/03/2014 FINAL   Final     Studies: Dg Chest 2 View  01/02/2014   CLINICAL DATA:  Tremors, shoulder pain  EXAM: CHEST  2 VIEW  COMPARISON:  None.  FINDINGS: The cardiac and mediastinal silhouettes are within normal limits.  Lungs are well inflated. There is asymmetric patchy opacity within the left lower lobe, which may reflect a developing infectious pneumonitis. Aspiration could also be considered. No other focal infiltrate. No pulmonary edema or pleural effusion. No pneumothorax.  The osseous structures are within normal limits.  IMPRESSION: Patchy left lower lobe opacity. While this finding may reflect atelectasis or summation of shadows, possible infectious or aspiration pneumonitis could also be considered in the correct clinical setting.   Electronically Signed   By: Rise MuBenjamin  McClintock M.D.   On: 01/02/2014 22:57    Scheduled Meds: . atenolol  25 mg Oral Daily  . [START ON 01/04/2014] azithromycin  500 mg Intravenous Q24H  . carbidopa-levodopa  1 tablet Oral 4 times per day   And  . entacapone  200 mg Oral 4 times per day  . cefTRIAXone (ROCEPHIN)  IV  1 g Intravenous Q24H  . cefTRIAXone      . enoxaparin (LOVENOX) injection  40 mg Subcutaneous Q24H  . insulin aspart  0-15 Units Subcutaneous TID WC  . insulin aspart  0-5 Units Subcutaneous QHS  . rasagiline  1 mg Oral Daily  . rOPINIRole  4 mg Oral  3 times per day  . sertraline  100 mg Oral Daily  . sodium chloride  1,000 mL Intravenous Once     Curt BearsMorgan Mixon, PA-S    [facility] Pager [phone]. If 7PM-7AM, please contact night-coverage at [email or web address], password [email or web address] 01/03/2014, 9:44 AM  LOS: 1 day   Attending Patient seen and examined, agree with the assessment and plan as outlined above.Clinically improved with antibiotics, continue with current care.  Windell NorfolkS Adena Sima MD

## 2014-01-04 DIAGNOSIS — R7881 Bacteremia: Secondary | ICD-10-CM

## 2014-01-04 LAB — BASIC METABOLIC PANEL
BUN: 18 mg/dL (ref 6–23)
CALCIUM: 9 mg/dL (ref 8.4–10.5)
CHLORIDE: 92 meq/L — AB (ref 96–112)
CO2: 25 mEq/L (ref 19–32)
CREATININE: 1 mg/dL (ref 0.50–1.35)
GFR calc non Af Amer: 75 mL/min — ABNORMAL LOW (ref >90)
GFR, EST AFRICAN AMERICAN: 87 mL/min — AB (ref >90)
Glucose, Bld: 187 mg/dL — ABNORMAL HIGH (ref 70–99)
Potassium: 3.8 mEq/L (ref 3.7–5.3)
SODIUM: 131 meq/L — AB (ref 137–147)

## 2014-01-04 LAB — GLUCOSE, CAPILLARY
GLUCOSE-CAPILLARY: 178 mg/dL — AB (ref 70–99)
GLUCOSE-CAPILLARY: 210 mg/dL — AB (ref 70–99)
GLUCOSE-CAPILLARY: 143 mg/dL — AB (ref 70–99)
GLUCOSE-CAPILLARY: 184 mg/dL — AB (ref 70–99)

## 2014-01-04 MED ORDER — DEXTROSE 5 % IV SOLN
2.0000 g | INTRAVENOUS | Status: DC
  Administered 2014-01-04: 2 g via INTRAVENOUS
  Filled 2014-01-04 (×2): qty 2

## 2014-01-04 NOTE — Progress Notes (Signed)
Pt has positive blood cultures in aerobic bottle. Gram negative rods. MD notified.

## 2014-01-04 NOTE — Progress Notes (Signed)
PATIENT DETAILS Name: Cody [name] Age: 68 y.o. Sex: male Date of Birth: 09/26/1946 Admit Date: 01/02/2014 Admitting Physician Therisa Doyne, MD ZOX:[id number],VWUJW RUTH, MD  Subjective: No major complaints, doing much better.  Assessment/Plan: Principal Problem:   CAP (community acquired pneumonia) - Significantly improved, afebrile, leukocytosis resolved. - Continue with Rocephin and Zithromax and started on 3/15 - Blood cultures positive for gram-negative rods, Legionella and streptococcal urine antigen negative  Active Problems: Gram-negative bacteremia - One set of blood cultures done on 3/15 positive for gram-negative rods, on Rocephin-would change dose to 2 g IV daily. Repeat blood culture on 3/17. Await further culture sensitivity data.  Hyponatremia - Suspect secondary to HCTZ therapy, sodium at 131 today, clinically euvolemic, continue to monitor off IV fluids.  Diabetes mellitus  -stable.  -hold metformin and continue SSI.  -monitor CBGs.   Unspecified essential hypertension  -stable.  -hold lisinopril/HCTZ due to borderline hypotension.  -continue atenolol.   Parkinson's disease  -PT/OT evaluation done-current recommendations are for home health physical therapy services. -continue carbidopa, levodopa, entacapone, requip, and rasagiline.   Disposition: Remain inpatient  DVT Prophylaxis: Prophylactic Lovenox  Code Status: Full code  Family Communication daughter at bedside  Procedures:  None  CONSULTS:  None  Time spent 40 minutes-which includes 50% of the time with face-to-face with patient/ family and coordinating care related to the above assessment and plan.    MEDICATIONS: Scheduled Meds: . atenolol  25 mg Oral Daily  . azithromycin  500 mg Intravenous Q24H  . carbidopa-levodopa  1 tablet Oral 4 times per day   And  . entacapone  200 mg Oral 4 times per day  . cefTRIAXone (ROCEPHIN)  IV  2 g Intravenous Q24H  .  enoxaparin (LOVENOX) injection  40 mg Subcutaneous Q24H  . insulin aspart  0-15 Units Subcutaneous TID WC  . insulin aspart  0-5 Units Subcutaneous QHS  . rasagiline  1 mg Oral Daily  . rOPINIRole  4 mg Oral 3 times per day  . sertraline  100 mg Oral Daily  . sodium chloride  1,000 mL Intravenous Once   Continuous Infusions:  PRN Meds:.  Antibiotics: Anti-infectives   Start     Dose/Rate Route Frequency Ordered Stop   01/04/14 2200  cefTRIAXone (ROCEPHIN) 2 g in dextrose 5 % 50 mL IVPB     2 g 100 mL/hr over 30 Minutes Intravenous Every 24 hours 01/04/14 0711 01/10/14 2159   01/04/14 0000  azithromycin (ZITHROMAX) 500 mg in dextrose 5 % 250 mL IVPB     500 mg 250 mL/hr over 60 Minutes Intravenous Every 24 hours 01/03/14 0301 01/10/14 2359   01/03/14 2200  cefTRIAXone (ROCEPHIN) 1 g in dextrose 5 % 50 mL IVPB  Status:  Discontinued     1 g 100 mL/hr over 30 Minutes Intravenous Every 24 hours 01/03/14 0301 01/04/14 0711   01/02/14 2328  cefTRIAXone (ROCEPHIN) 1 G injection    Comments:  Aleene Davidson   : cabinet override      01/02/14 2328 01/03/14 1129   01/02/14 2315  cefTRIAXone (ROCEPHIN) 1 g in dextrose 5 % 50 mL IVPB     1 g 100 mL/hr over 30 Minutes Intravenous  Once 01/02/14 2312 01/03/14 0021   01/02/14 2315  azithromycin (ZITHROMAX) 500 mg in dextrose 5 % 250 mL IVPB     500 mg 250 mL/hr over 60 Minutes Intravenous  Once 01/02/14 2312 01/03/14 0130  PHYSICAL EXAM: Vital signs in last 24 hours: Filed Vitals:   01/03/14 2137 01/04/14 0443 01/04/14 0951 01/04/14 1045  BP: 113/75 119/74 113/63 114/67  Pulse: 78 71 74 74  Temp: 98.5 F (36.9 C) 97.7 F (36.5 C)    TempSrc: Oral Oral    Resp: 18 18    Height:      Weight:  100.426 kg (221 lb 6.4 oz)    SpO2: 92% 92%      Weight change: 0.181 kg (6.4 oz) Filed Weights   01/02/14 2114 01/03/14 0148 01/04/14 0443  Weight: 100.245 kg (221 lb) 100.835 kg (222 lb 4.8 oz) 100.426 kg (221 lb 6.4 oz)   Body mass  index is 41.59 kg/(m^2).   Gen Exam: Awake and alert with clear speech.   Neck: Supple, No JVD.   Chest: B/L Clear.   CVS: S1 S2 Regular, no murmurs.  Abdomen: soft, BS +, non tender, non distended.  Extremities: no edema, lower extremities warm to touch. Neurologic: Non Focal.   Skin: No Rash.   Wounds: N/A.   Intake/Output from previous day:  Intake/Output Summary (Last 24 hours) at 01/04/14 1321 Last data filed at 01/04/14 0905  Gross per 24 hour  Intake    560 ml  Output    701 ml  Net   -141 ml     LAB RESULTS: CBC  Recent Labs Lab 01/02/14 2220 01/03/14 0540  WBC 14.8* 11.8*  HGB 14.1 14.2  HCT 40.4 40.7  PLT 167 153  MCV 88.0 86.8  MCH 30.7 30.3  MCHC 34.9 34.9  RDW 13.3 13.5  LYMPHSABS  --  1.0  MONOABS  --  1.2*  EOSABS  --  0.0  BASOSABS  --  0.0    Chemistries   Recent Labs Lab 01/02/14 2220 01/03/14 0540 01/04/14 0638  NA 132* 130* 131*  K 4.0 3.6* 3.8  CL 94* 92* 92*  CO2 24 22 25   GLUCOSE 224* 191* 187*  BUN 26* 19 18  CREATININE 1.10 0.90 1.00  CALCIUM 9.6 9.1 9.0    CBG:  Recent Labs Lab 01/03/14 1737 01/03/14 2145 01/03/14 2225 01/04/14 0757 01/04/14 1134  GLUCAP 184* 143* 156* 178* 210*    GFR Estimated Creatinine Clearance: 71.8 ml/min (by C-G formula based on Cr of 1).  Coagulation profile No results found for this basename: INR, PROTIME,  in the last 168 hours  Cardiac Enzymes No results found for this basename: CK, CKMB, TROPONINI, MYOGLOBIN,  in the last 168 hours  No components found with this basename: POCBNP,  No results found for this basename: DDIMER,  in the last 72 hours No results found for this basename: HGBA1C,  in the last 72 hours No results found for this basename: CHOL, HDL, LDLCALC, TRIG, CHOLHDL, LDLDIRECT,  in the last 72 hours No results found for this basename: TSH, T4TOTAL, FREET3, T3FREE, THYROIDAB,  in the last 72 hours No results found for this basename: VITAMINB12, FOLATE, FERRITIN,  TIBC, IRON, RETICCTPCT,  in the last 72 hours No results found for this basename: LIPASE, AMYLASE,  in the last 72 hours  Urine Studies No results found for this basename: UACOL, UAPR, USPG, UPH, UTP, UGL, UKET, UBIL, UHGB, UNIT, UROB, ULEU, UEPI, UWBC, URBC, UBAC, CAST, CRYS, UCOM, BILUA,  in the last 72 hours  MICROBIOLOGY: Recent Results (from the past 240 hour(s))  CULTURE, BLOOD (ROUTINE X 2)     Status: None   Collection Time  01/02/14 11:43 PM      Result Value Ref Range Status   Specimen Description BLOOD RIGHT ARM   Final   Special Requests BOTTLES DRAWN AEROBIC AND ANAEROBIC [facility] EACH   Final   Culture  Setup Time     Final   Value: 01/03/2014 08:24     Performed at [facility]   Culture     Final   Value: GRAM NEGATIVE RODS     Note: Gram Stain Report Called to,Read Back By and Verified With: Judee Clara 01/04/14 0452A FULKC     Performed at [facility]   Report Status PENDING   Incomplete  CULTURE, BLOOD (ROUTINE X 2)     Status: None   Collection Time    01/02/14 11:44 PM      Result Value Ref Range Status   Specimen Description BLOOD RIGHT FOREARM   Final   Special Requests BOTTLES DRAWN AEROBIC AND ANAEROBIC 5CC EACH   Final   Culture  Setup Time     Final   Value: 01/03/2014 08:25     Performed at [facility]   Culture     Final   Value:        BLOOD CULTURE RECEIVED NO GROWTH TO DATE CULTURE WILL BE HELD FOR 5 DAYS BEFORE ISSUING A FINAL NEGATIVE REPORT     Performed at [facility]   Report Status PENDING   Incomplete  GRAM STAIN     Status: None   Collection Time    01/03/14  5:26 AM      Result Value Ref Range Status   Specimen Description URINE, CLEAN CATCH   Final   Special Requests NONE   Final   Gram Stain     Final   Value: CYTOSPIN SLIDE     SQUAMOUS EPITHELIAL CELLS PRESENT     WBC PRESENT, PREDOMINANTLY PMN     GRAM NEGATIVE RODS   Report Status 01/03/2014 FINAL   Final    RADIOLOGY  STUDIES/RESULTS: Dg Chest 2 View  01/02/2014   CLINICAL DATA:  Tremors, shoulder pain  EXAM: CHEST  2 VIEW  COMPARISON:  None.  FINDINGS: The cardiac and mediastinal silhouettes are within normal limits.  Lungs are well inflated. There is asymmetric patchy opacity within the left lower lobe, which may reflect a developing infectious pneumonitis. Aspiration could also be considered. No other focal infiltrate. No pulmonary edema or pleural effusion. No pneumothorax.  The osseous structures are within normal limits.  IMPRESSION: Patchy left lower lobe opacity. While this finding may reflect atelectasis or summation of shadows, possible infectious or aspiration pneumonitis could also be considered in the correct clinical setting.   Electronically Signed   By: Rise Mu M.D.   On: 01/02/2014 22:57    Jeoffrey Massed, MD  [facility] Pager:[phone]  If 7PM-7AM, please contact night-coverage [email or web address] Password [email or web address] 01/04/2014, 1:21 PM   LOS: 2 days

## 2014-01-05 DIAGNOSIS — M549 Dorsalgia, unspecified: Secondary | ICD-10-CM

## 2014-01-05 LAB — BASIC METABOLIC PANEL
BUN: 20 mg/dL (ref 6–23)
CALCIUM: 8.8 mg/dL (ref 8.4–10.5)
CO2: 24 mEq/L (ref 19–32)
Chloride: 95 mEq/L — ABNORMAL LOW (ref 96–112)
Creatinine, Ser: 0.92 mg/dL (ref 0.50–1.35)
GFR, EST NON AFRICAN AMERICAN: 85 mL/min — AB (ref >90)
Glucose, Bld: 170 mg/dL — ABNORMAL HIGH (ref 70–99)
POTASSIUM: 3.8 meq/L (ref 3.7–5.3)
Sodium: 133 mEq/L — ABNORMAL LOW (ref 137–147)

## 2014-01-05 LAB — GLUCOSE, CAPILLARY
GLUCOSE-CAPILLARY: 148 mg/dL — AB (ref 70–99)
GLUCOSE-CAPILLARY: 154 mg/dL — AB (ref 70–99)
Glucose-Capillary: 140 mg/dL — ABNORMAL HIGH (ref 70–99)

## 2014-01-05 LAB — CBC
HCT: 39.7 % (ref 39.0–52.0)
Hemoglobin: 14.1 g/dL (ref 13.0–17.0)
MCH: 30.4 pg (ref 26.0–34.0)
MCHC: 35.5 g/dL (ref 30.0–36.0)
MCV: 85.6 fL (ref 78.0–100.0)
PLATELETS: 149 10*3/uL — AB (ref 150–400)
RBC: 4.64 MIL/uL (ref 4.22–5.81)
RDW: 13.3 % (ref 11.5–15.5)
WBC: 6 10*3/uL (ref 4.0–10.5)

## 2014-01-05 MED ORDER — AZITHROMYCIN 500 MG PO TABS: 500.0000 mg | ORAL_TABLET | Freq: Every day | ORAL | Status: AC

## 2014-01-05 MED ORDER — CEFUROXIME AXETIL 500 MG PO TABS: 500.0000 mg | ORAL_TABLET | Freq: Two times a day (BID) | ORAL | Status: AC

## 2014-01-05 NOTE — Progress Notes (Signed)
Patient IV removed and discharge instruction reviewed. Prescriptions received. Patient verbalized understanding using teach back.

## 2014-01-05 NOTE — Progress Notes (Signed)
Physical Therapy Treatment Patient Details Name: Cody [name] MRN: [id number] DOB: 04-18-1946 Today's Date: 01/05/2014 Time: 0849-0900 PT Time Calculation (min): 11 min  PT Assessment / Plan / Recommendation  History of Present Illness Cody [name] is a 68 y.o. male with Past medical history of parkinsonism, diabetes, hypertension.  Presents to hospital with exacerbation of Parkinson's symptoms, CXR revealed CAP   PT Comments   Pt progressing with mobility today. Presents with decreased independence with mobility and is very unsteady with gt. Pt a high fall risk due to unsteadiness. Discussed D/C disposition with pt; pt reports he can make arrangements to have 24/7 (A) upon D/C. Recommend pt ambulate with AD at all times upon D/C; pt agreeable.   Follow Up Recommendations  Home health PT;Supervision/Assistance - 24 hour     Does the patient have the potential to tolerate intense rehabilitation     Barriers to Discharge        Equipment Recommendations  None recommended by PT    Recommendations for Other Services    Frequency Min 3X/week   Progress towards PT Goals Progress towards PT goals: Progressing toward goals  Plan Current plan remains appropriate    Precautions / Restrictions Precautions Precautions: Fall Restrictions Weight Bearing Restrictions: No   Pertinent Vitals/Pain No complaints    Mobility  Bed Mobility General bed mobility comments: pt sitting in recliner and returned to recliner  Transfers Overall transfer level: Needs assistance Equipment used: None Transfers: Sit to/from Stand Sit to Stand: Min guard General transfer comment: min guard to steady and cues for hand placement Ambulation/Gait Ambulation/Gait assistance: Min assist Ambulation Distance (Feet): 200 Feet Assistive device: None Gait Pattern/deviations: Staggering right;Staggering left;Scissoring;Narrow base of support Gait velocity: impulsively fast and unsafe  General Gait Details: pt  very unsteady with gt; demo multiple staggers and required (A) at all times to maintain balance; cues for safety; pt reaching for handrailing in hallway to steady; will require use of AD for stability    Exercises Other Exercises Other Exercises: pt sitting in chair at end of session performing his parkinson's stretches    PT Diagnosis:    PT Problem List:   PT Treatment Interventions:     PT Goals (current goals can now be found in the care plan section) Acute Rehab PT Goals Patient Stated Goal: "I'm going home." PT Goal Formulation: With patient Time For Goal Achievement: 01/10/14 Potential to Achieve Goals: Good  Visit Information  Last PT Received On: 01/05/14 Assistance Needed: +1 History of Present Illness: Cody [name] is a 68 y.o. male with Past medical history of parkinsonism, diabetes, hypertension.  Presents to hospital with exacerbation of Parkinson's symptoms, CXR revealed CAP    Subjective Data  Subjective: Pt sitting in chair " i would like to sit in that other chair and do my stretches"  Patient Stated Goal: "I'm going home."   Cognition  Cognition Arousal/Alertness: Awake/alert Behavior During Therapy: WFL for tasks assessed/performed Overall Cognitive Status: Within Functional Limits for tasks assessed    Balance  Balance Overall balance assessment: Needs assistance Standing balance support: During functional activity;Single extremity supported Standing balance-Leahy Scale: Poor Standing balance comment: pt unsteady; requires UE (A) to maintain balance  General Comments General comments (skin integrity, edema, etc.): initially pt was reporting he would not hae 24/7 (A); after ambulating and speaking with pt, he reported he could make arrangements to have 24/7 (A) upno acute D/C  End of Session PT - End of Session Equipment Utilized During Treatment: Gait  belt Activity Tolerance: Patient tolerated treatment well Patient left: in chair;with call bell/phone  within reach Nurse Communication: Mobility status   GP     Donell SievertWest, Cody [name], [place] CarolinaPT [phone] 01/05/2014, 9:07 AM

## 2014-01-05 NOTE — Progress Notes (Signed)
01/05/14 Spoke with patient about PT recommendation of HHPT with 24hr assistance. Patient states that he lives alone in a senior apartment complex. I explained that he probably could go to a SNF for short term rehab if he would rather, he stated that he would rather go home with HHPT visits. He stated that he has a call bell and that people check on him. He uses a walker prn.He selected [facility] ,contacted Lupita LeashDonna with [facility] and set up HHPT. Jacquelynn CreeMary Izek Corvino RN, BSN, CCM

## 2014-01-05 NOTE — Discharge Summary (Signed)
Physician Discharge Summary  Cody [name] WUJ:[id number] DOB: April 25, 1946 DOA: 01/02/2014  PCP: [name], MD  Admit date: 01/02/2014 Discharge date: 01/05/2014  Time spent: 45 minutes  Recommendations for Outpatient Follow-up:  1. Home health physical therapy services 2. Finish a total 14 days course of Ceftin for bacteremia and 5 days course of azithromycin. 3. F/u with PCP for BP control- HCTZ-lisinopril held d/t borderline hypotension and mild hyponatremia, BP stable.   History of present illness:  68 yo male with parkinsonism presented with weakness and tremors x 2-3 days on 3/15 with urinary incontinence and 1 episode of vommitting. no recent change in medication. Pt had significant leukocytosis, a urine culture indicative of a UTI and gram negative rods on blood culture, CT indicated a patchy L lower lobe opacity consistent with pneumonia. Cough with yellow sputum production began 3/16. Patients parkinsonism symptoms had become exacerbated by infection and have returned to baseline. Patient has demonstrated significant clinical improvement  Discharge Diagnoses:   CAP (community acquired pneumonia)  - Significantly improved, afebrile, leukocytosis resolved.   - Blood cultures positive for gram-negative rods,  - Legionella and streptococcal urine antigen negative. - blood cultures pending: due to clinical improvement, pt is stable for discharge, will  f/u if cultures indicate need for different treatment. -Discharge on Ceftin to complete 14 days of antibiotics.  Gram-negative bacteremia  -A total 14 days of antibiotics for gram-negative bacteremia. -At the time of discharge there was no identification for the GNR. -Patient was on Rocephin in the hospital and became afebrile and WBC improved, discharged home with same.  UTI -Admitted with urinalysis consistent with UTI for some reason culture was not done -Finish Ceftin  Diabetes mellitus  - resume home meds    Unspecified essential hypertension  -stable.  -continue atenolol.   Parkinson's disease  -PT/OT evaluation done-current recommendations are for home health physical therapy services.  -continue carbidopa, levodopa, entacapone, requip, and rasagiline.    Discharge Condition: stable  Diet recommendation: hearth healthy  Filed Weights   01/03/14 0148 01/04/14 0443 01/05/14 0644  Weight: 100.835 kg (222 lb 4.8 oz) 100.426 kg (221 lb 6.4 oz) 98.657 kg (217 lb 8 oz)    Hospital Course:  4 days  Discharge Exam: Filed Vitals:   01/05/14 1500  BP:   Pulse: 65  Temp: 97.3 F (36.3 C)  Resp: 20   Gen Exam: Awake and alert with clear speech.  Neck: Supple, No JVD.  Chest: B/L Clear. No wheezes CVS: S1 S2 Regular, no murmurs.  Abdomen: soft, BS +, non tender, non distended.  Extremities: no edema, lower extremities mildly warm to touch.  Neurologic: Non Focal.  Skin: No Rash, lesions or exudates.  Psych: normal affect, judgement and insight intact. Mild rigidity      Discharge Instructions     Medication List         atenolol 25 MG tablet  Commonly known as:  TENORMIN  Take 25 mg by mouth daily.     azithromycin 500 MG tablet  Commonly known as:  ZITHROMAX  Take 1 tablet (500 mg total) by mouth daily.  Start taking on:  01/06/2014     carbidopa-levodopa-entacapone 50-200-200 MG per tablet  Commonly known as:  STALEVO  Take 1 tablet by mouth 4 (four) times daily. At 6am, 10am, 2pm, and 6pm     cefUROXime 500 MG tablet  Commonly known as:  CEFTIN  Take 1 tablet (500 mg total) by mouth 2 (two) times daily.  Start  taking on:  01/06/2014     lisinopril-hydrochlorothiazide 10-12.5 MG per tablet  Commonly known as:  PRINZIDE,ZESTORETIC  Take 1 tablet by mouth daily.     metFORMIN 500 MG tablet  Commonly known as:  GLUCOPHAGE  Take 1,000 mg by mouth 2 (two) times daily with a meal.     rasagiline 1 MG Tabs tablet  Commonly known as:  AZILECT  Take 1 mg by  mouth daily.     rOPINIRole 4 MG 24 hr tablet  Commonly known as:  REQUIP XL  Take 12 mg by mouth at bedtime.     sertraline 100 MG tablet  Commonly known as:  ZOLOFT  Take 100 mg by mouth daily.       Allergies  Allergen Reactions  . Amoxapine And Related Swelling  . Amoxicillin Swelling   Follow-up Information   Follow up with [facility]. (home physical therapy visits-they will contact you)    Contact information:   [address] [place] [address] [phone]       Follow up with [name], MD In 1 week.   Specialty:  Family Medicine   Contact information:   [address] [place] [address] [phone]        The results of significant diagnostics from this hospitalization (including imaging, microbiology, ancillary and laboratory) are listed below for reference.    Significant Diagnostic Studies: Dg Chest 2 View  01/02/2014   CLINICAL DATA:  Tremors, shoulder pain  EXAM: CHEST  2 VIEW  COMPARISON:  None.  FINDINGS: The cardiac and mediastinal silhouettes are within normal limits.  Lungs are well inflated. There is asymmetric patchy opacity within the left lower lobe, which may reflect a developing infectious pneumonitis. Aspiration could also be considered. No other focal infiltrate. No pulmonary edema or pleural effusion. No pneumothorax.  The osseous structures are within normal limits.  IMPRESSION: Patchy left lower lobe opacity. While this finding may reflect atelectasis or summation of shadows, possible infectious or aspiration pneumonitis could also be considered in the correct clinical setting.   Electronically Signed   By: Rise Mu M.D.   On: 01/02/2014 22:57    Microbiology: Recent Results (from the past 240 hour(s))  CULTURE, BLOOD (ROUTINE X 2)     Status: None   Collection Time    01/02/14 11:43 PM      Result Value Ref Range Status   Specimen Description BLOOD RIGHT ARM   Final    Special Requests BOTTLES DRAWN AEROBIC AND ANAEROBIC [facility] EACH   Final   Culture  Setup Time     Final   Value: 01/03/2014 08:24     Performed at [facility]   Culture     Final   Value: GRAM NEGATIVE RODS     Note: Gram Stain Report Called to,Read Back By and Verified With: Judee Clara 01/04/14 0452A FULKC     Performed at [facility]   Report Status PENDING   Incomplete  CULTURE, BLOOD (ROUTINE X 2)     Status: None   Collection Time    01/02/14 11:44 PM      Result Value Ref Range Status   Specimen Description BLOOD RIGHT FOREARM   Final   Special Requests BOTTLES DRAWN AEROBIC AND ANAEROBIC [facility] EACH   Final   Culture  Setup Time     Final   Value: 01/03/2014 08:25     Performed at [facility]  Culture     Final   Value:        BLOOD CULTURE RECEIVED NO GROWTH TO DATE CULTURE WILL BE HELD FOR 5 DAYS BEFORE ISSUING A FINAL NEGATIVE REPORT     [facility]   Report Status PENDING   Incomplete  GRAM STAIN     Status: None   Collection Time    01/03/14  5:26 AM      Result Value Ref Range Status   Specimen Description URINE, CLEAN CATCH   Final   Special Requests NONE   Final   Gram Stain     Final   Value: CYTOSPIN SLIDE     SQUAMOUS EPITHELIAL CELLS PRESENT     WBC PRESENT, PREDOMINANTLY PMN     GRAM NEGATIVE RODS   Report Status 01/03/2014 FINAL   Final  CULTURE, BLOOD (ROUTINE X 2)     Status: None   Collection Time    01/04/14  9:08 AM      Result Value Ref Range Status   Specimen Description BLOOD LEFT FOREARM   Final   Special Requests BOTTLES DRAWN AEROBIC AND ANAEROBIC 10CC   Final   Culture  Setup Time     Final   Value: 01/04/2014 14:03     [facility]formed at [facility]   Culture     Final   Value:        BLOOD CULTURE RECEIVED NO GROWTH TO DATE CULTURE WILL BE HELD FOR 5 DAYS BEFORE ISSUING A FINAL NEGATIVE REPORT     [facility]formed at [facility]   Report Status PENDING   Incomplete  CULTURE, BLOOD  (ROUTINE X 2)     Status: None   Collection Time    01/04/14  9:15 AM      Result Value Ref Range Status   Specimen Description BLOOD LEFT HAND   Final   Special Requests BOTTLES DRAWN AEROBIC AND ANAEROBIC 10CC   Final   Culture  Setup Time     Final   Value: 01/04/2014 14:03     [facility]formed at [facility]   Culture     Final   Value:        BLOOD CULTURE RECEIVED NO GROWTH TO DATE CULTURE WILL BE HELD FOR 5 DAYS BEFORE ISSUING A FINAL NEGATIVE REPORT     [facility]formed at [facility]   Report Status PENDING   Incomplete     Labs: Basic Metabolic Panel:  Recent Labs Lab 01/02/14 2220 01/03/14 0540 01/04/14 0638 01/05/14 0448  NA 132* 130* 131* 133*  K 4.0 3.6* 3.8 3.8  CL 94* 92* 92* 95*  CO2 24 22 25 24   GLUCOSE 224* 191* 187* 170*  BUN 26* 19 18 20   CREATININE 1.10 0.90 1.00 0.92  CALCIUM 9.6 9.1 9.0 8.8   Liver Function Tests:  Recent Labs Lab 01/03/14 0540  AST 20  ALT 28  ALKPHOS 54  BILITOT 1.4*  PROT 6.8  ALBUMIN 3.6   CBC:  Recent Labs Lab 01/02/14 2220 01/03/14 0540 01/05/14 0448  WBC 14.8* 11.8* 6.0  NEUTROABS  --  9.6*  --   HGB 14.1 14.2 14.1  HCT 40.4 40.7 39.7  MCV 88.0 86.8 85.6  PLT 167 153 149*   CBG:  Recent Labs Lab 01/04/14 1741 01/04/14 2216 01/05/14 0805 01/05/14 0821 01/05/14 1204  GLUCAP 143* 184* 154* 148* 140*       Signed: Verbena Boeding A, PA-S  [facility] 01/05/2014, 3:36 PM   Addendum  Patient seen and examined, chart and data base reviewed.  I agree with the above assessment and plan.  For full details please see Mrs. Elease Hashimoto, PA-S note.  I reviewed and addended the above note as needed.   Clint Lipps, MD [facility] Pager: [phone] 01/05/2014, 3:36 PM

## 2014-01-06 LAB — CULTURE, BLOOD (ROUTINE X 2)

## 2014-01-09 LAB — CULTURE, BLOOD (ROUTINE X 2): Culture: NO GROWTH

## 2014-01-10 LAB — CULTURE, BLOOD (ROUTINE X 2)
Culture: NO GROWTH
Culture: NO GROWTH

## 2014-08-20 ENCOUNTER — Inpatient Hospital Stay (HOSPITAL_COMMUNITY)
Admission: EM | Admit: 2014-08-20 | Discharge: 2014-08-25 | DRG: 871 | Disposition: A | Discharge status: Home Health | Payer: Medicare HMO | Attending: Internal Medicine | Admitting: Internal Medicine

## 2014-08-20 ENCOUNTER — Encounter (HOSPITAL_COMMUNITY): Payer: Self-pay | Admitting: Emergency Medicine

## 2014-08-20 ENCOUNTER — Inpatient Hospital Stay (HOSPITAL_COMMUNITY): Payer: Medicare HMO

## 2014-08-20 ENCOUNTER — Emergency Department (HOSPITAL_COMMUNITY): Payer: Medicare HMO

## 2014-08-20 DIAGNOSIS — A414 Sepsis due to anaerobes: Secondary | ICD-10-CM

## 2014-08-20 DIAGNOSIS — E119 Type 2 diabetes mellitus without complications: Secondary | ICD-10-CM | POA: Diagnosis present

## 2014-08-20 DIAGNOSIS — N179 Acute kidney failure, unspecified: Secondary | ICD-10-CM | POA: Diagnosis present

## 2014-08-20 DIAGNOSIS — R918 Other nonspecific abnormal finding of lung field: Secondary | ICD-10-CM

## 2014-08-20 DIAGNOSIS — T50905A Adverse effect of unspecified drugs, medicaments and biological substances, initial encounter: Secondary | ICD-10-CM | POA: Diagnosis not present

## 2014-08-20 DIAGNOSIS — E869 Volume depletion, unspecified: Secondary | ICD-10-CM | POA: Diagnosis present

## 2014-08-20 DIAGNOSIS — R109 Unspecified abdominal pain: Secondary | ICD-10-CM

## 2014-08-20 DIAGNOSIS — Z833 Family history of diabetes mellitus: Secondary | ICD-10-CM | POA: Diagnosis not present

## 2014-08-20 DIAGNOSIS — G20 Parkinson's disease: Secondary | ICD-10-CM | POA: Diagnosis present

## 2014-08-20 DIAGNOSIS — L270 Generalized skin eruption due to drugs and medicaments taken internally: Secondary | ICD-10-CM | POA: Diagnosis not present

## 2014-08-20 DIAGNOSIS — K5900 Constipation, unspecified: Secondary | ICD-10-CM | POA: Diagnosis not present

## 2014-08-20 DIAGNOSIS — N10 Acute tubulo-interstitial nephritis: Secondary | ICD-10-CM | POA: Diagnosis present

## 2014-08-20 DIAGNOSIS — A4159 Other Gram-negative sepsis: Principal | ICD-10-CM

## 2014-08-20 DIAGNOSIS — E8770 Fluid overload, unspecified: Secondary | ICD-10-CM | POA: Diagnosis present

## 2014-08-20 DIAGNOSIS — A419 Sepsis, unspecified organism: Secondary | ICD-10-CM | POA: Diagnosis present

## 2014-08-20 DIAGNOSIS — I10 Essential (primary) hypertension: Secondary | ICD-10-CM | POA: Diagnosis present

## 2014-08-20 DIAGNOSIS — J9601 Acute respiratory failure with hypoxia: Secondary | ICD-10-CM | POA: Diagnosis not present

## 2014-08-20 DIAGNOSIS — B9689 Other specified bacterial agents as the cause of diseases classified elsewhere: Secondary | ICD-10-CM | POA: Diagnosis present

## 2014-08-20 DIAGNOSIS — F329 Major depressive disorder, single episode, unspecified: Secondary | ICD-10-CM | POA: Diagnosis present

## 2014-08-20 DIAGNOSIS — D696 Thrombocytopenia, unspecified: Secondary | ICD-10-CM

## 2014-08-20 DIAGNOSIS — Z9109 Other allergy status, other than to drugs and biological substances: Secondary | ICD-10-CM

## 2014-08-20 DIAGNOSIS — Z880 Allergy status to penicillin: Secondary | ICD-10-CM

## 2014-08-20 DIAGNOSIS — R0600 Dyspnea, unspecified: Secondary | ICD-10-CM

## 2014-08-20 DIAGNOSIS — N201 Calculus of ureter: Secondary | ICD-10-CM | POA: Diagnosis present

## 2014-08-20 DIAGNOSIS — N390 Urinary tract infection, site not specified: Secondary | ICD-10-CM

## 2014-08-20 DIAGNOSIS — Z79899 Other long term (current) drug therapy: Secondary | ICD-10-CM | POA: Diagnosis not present

## 2014-08-20 DIAGNOSIS — D72829 Elevated white blood cell count, unspecified: Secondary | ICD-10-CM | POA: Diagnosis present

## 2014-08-20 DIAGNOSIS — Z87442 Personal history of urinary calculi: Secondary | ICD-10-CM

## 2014-08-20 DIAGNOSIS — R7881 Bacteremia: Secondary | ICD-10-CM

## 2014-08-20 DIAGNOSIS — Z823 Family history of stroke: Secondary | ICD-10-CM | POA: Diagnosis not present

## 2014-08-20 DIAGNOSIS — G20A1 Parkinson's disease without dyskinesia, without mention of fluctuations: Secondary | ICD-10-CM | POA: Diagnosis present

## 2014-08-20 LAB — URINALYSIS, ROUTINE W REFLEX MICROSCOPIC
Bilirubin Urine: NEGATIVE
Glucose, UA: NEGATIVE mg/dL
Ketones, ur: NEGATIVE mg/dL
NITRITE: NEGATIVE
PROTEIN: 30 mg/dL — AB
SPECIFIC GRAVITY, URINE: 1.017 (ref 1.005–1.030)
UROBILINOGEN UA: 0.2 mg/dL (ref 0.0–1.0)
pH: 7.5 (ref 5.0–8.0)

## 2014-08-20 LAB — CBC WITH DIFFERENTIAL/PLATELET
BASOS PCT: 0 % (ref 0–1)
Basophils Absolute: 0 10*3/uL (ref 0.0–0.1)
Eosinophils Absolute: 0.1 10*3/uL (ref 0.0–0.7)
Eosinophils Relative: 0 % (ref 0–5)
HCT: 42.5 % (ref 39.0–52.0)
Hemoglobin: 14.3 g/dL (ref 13.0–17.0)
LYMPHS ABS: 1.1 10*3/uL (ref 0.7–4.0)
Lymphocytes Relative: 8 % — ABNORMAL LOW (ref 12–46)
MCH: 29.7 pg (ref 26.0–34.0)
MCHC: 33.6 g/dL (ref 30.0–36.0)
MCV: 88.2 fL (ref 78.0–100.0)
MONOS PCT: 5 % (ref 3–12)
Monocytes Absolute: 0.7 10*3/uL (ref 0.1–1.0)
NEUTROS PCT: 87 % — AB (ref 43–77)
Neutro Abs: 11.1 10*3/uL — ABNORMAL HIGH (ref 1.7–7.7)
PLATELETS: 184 10*3/uL (ref 150–400)
RBC: 4.82 MIL/uL (ref 4.22–5.81)
RDW: 13.1 % (ref 11.5–15.5)
WBC: 12.9 10*3/uL — ABNORMAL HIGH (ref 4.0–10.5)

## 2014-08-20 LAB — COMPREHENSIVE METABOLIC PANEL
ALK PHOS: 68 U/L (ref 39–117)
ALT: 6 U/L (ref 0–53)
ANION GAP: 15 (ref 5–15)
AST: 27 U/L (ref 0–37)
Albumin: 4.1 g/dL (ref 3.5–5.2)
BUN: 26 mg/dL — AB (ref 6–23)
CALCIUM: 9.1 mg/dL (ref 8.4–10.5)
CO2: 25 mEq/L (ref 19–32)
Chloride: 94 mEq/L — ABNORMAL LOW (ref 96–112)
Creatinine, Ser: 1.63 mg/dL — ABNORMAL HIGH (ref 0.50–1.35)
GFR calc non Af Amer: 41 mL/min — ABNORMAL LOW (ref >90)
GFR, EST AFRICAN AMERICAN: 48 mL/min — AB (ref >90)
GLUCOSE: 202 mg/dL — AB (ref 70–99)
POTASSIUM: 4 meq/L (ref 3.7–5.3)
SODIUM: 134 meq/L — AB (ref 137–147)
Total Bilirubin: 1.1 mg/dL (ref 0.3–1.2)
Total Protein: 7.5 g/dL (ref 6.0–8.3)

## 2014-08-20 LAB — LIPASE, BLOOD: Lipase: 22 U/L (ref 11–59)

## 2014-08-20 LAB — URINE MICROSCOPIC-ADD ON

## 2014-08-20 MED ORDER — SODIUM CHLORIDE 0.9 % IV BOLUS (SEPSIS)
1000.0000 mL | Freq: Once | INTRAVENOUS | Status: AC
  Administered 2014-08-20: 1000 mL via INTRAVENOUS

## 2014-08-20 MED ORDER — ACETAMINOPHEN 650 MG RE SUPP
650.0000 mg | Freq: Once | RECTAL | Status: AC
  Administered 2014-08-20: 650 mg via RECTAL
  Filled 2014-08-20: qty 1

## 2014-08-20 MED ORDER — KETOROLAC TROMETHAMINE 30 MG/ML IJ SOLN
30.0000 mg | Freq: Once | INTRAMUSCULAR | Status: AC
  Administered 2014-08-20: 30 mg via INTRAVENOUS
  Filled 2014-08-20: qty 1

## 2014-08-20 MED ORDER — MORPHINE SULFATE 4 MG/ML IJ SOLN
4.0000 mg | Freq: Once | INTRAMUSCULAR | Status: AC
  Administered 2014-08-20: 4 mg via INTRAVENOUS
  Filled 2014-08-20: qty 1

## 2014-08-20 MED ORDER — DEXTROSE 5 % IV SOLN
1.0000 g | Freq: Once | INTRAVENOUS | Status: DC
  Administered 2014-08-20: 1 g via INTRAVENOUS
  Filled 2014-08-20: qty 10

## 2014-08-20 MED ORDER — ONDANSETRON 4 MG PO TBDP
8.0000 mg | ORAL_TABLET | Freq: Once | ORAL | Status: AC
  Administered 2014-08-20: 8 mg via ORAL
  Filled 2014-08-20: qty 2

## 2014-08-20 NOTE — ED Notes (Signed)
Contact[facility]; Dr. Manson PasseyAlma Devine receiving;  [phone]

## 2014-08-20 NOTE — ED Notes (Signed)
The pt actively vomiting in  triage

## 2014-08-20 NOTE — ED Provider Notes (Signed)
CSN: [id number]     Arrival date & time 08/20/14  1948 History   First MD Initiated Contact with Patient 08/20/14 2059     Chief Complaint  Patient presents with  . Flank Pain    (Consider location/radiation/quality/duration/timing/severity/associated sxs/prior Treatment) HPI Comments: Patient is a 68 year old male with a history of diabetes mellitus, hypertension, and Parkinson disease. He presents to the emergency department for further evaluation of right flank pain. He describes the pain as throbbing in nature. Pain also exists in his R suprapubic region. Patient denies radiation from the R flank to his suprapubic abdomen. Pain began at 0300 today and worsened at 1800. Upon worsening of symptoms, patient experienced nausea as well as 7 episodes of nonbloody, nonbilious emesis. He states he took Advil without relief of symptoms. Symptoms have been associated with urinary urgency with onset, just prior to symptoms, at 0230. He denies associated fever, chest pain, shortness of breath, diarrhea, melena or hematochezia, hematuria, dysuria, bowel or bladder incontinence, and extremity numbness/weakness. Patient states he has a history of kidney stones and that the pain feels similar. He has urology f/u scheduled on November 30th for evaluation of hematuria found on 1 year physical work up at the [place]. Last BM at 0330 today; normal, per patient.  Patient is a 68 y.o. male presenting with flank pain. The history is provided by the patient. No language interpreter was used.  Flank Pain Associated symptoms include abdominal pain, nausea and vomiting. Pertinent negatives include no chest pain, fever, numbness or weakness.    Past Medical History  Diagnosis Date  . Back pain     lumbar  . Urgency of urination   . Hammer toe   . Diabetes mellitus   . Unspecified essential hypertension   . Parkinson disease    Past Surgical History  Procedure Laterality Date  . Toe surgery Right     big toe  . Toe  surgery Left     2nd toe   Family History  Problem Relation Age of Onset  . High blood pressure Mother   . Diabetes Mother   . Stroke Father    History  Substance Use Topics  . Smoking status: Never Smoker   . Smokeless tobacco: Not on file  . Alcohol Use: No    Review of Systems  Constitutional: Negative for fever.  Respiratory: Negative for shortness of breath.   Cardiovascular: Negative for chest pain.  Gastrointestinal: Positive for nausea, vomiting and abdominal pain.  Genitourinary: Positive for urgency and flank pain. Negative for dysuria and hematuria.  Neurological: Negative for weakness and numbness.  All other systems reviewed and are negative.   Allergies  Amoxapine and related and Amoxicillin  Home Medications   Prior to Admission medications   Medication Sig Start Date End Date Taking? Authorizing Provider  atenolol (TENORMIN) 25 MG tablet Take 25 mg by mouth daily.   Yes Historical Provider, MD  carbidopa-levodopa-entacapone (STALEVO) 50-200-200 MG per tablet Take 1 tablet by mouth 4 (four) times daily. At 6am, 10am, 2pm, and 6pm 01/22/13  Yes Levert Feinstein, MD  Cholecalciferol (VITAMIN D3) 3000 UNITS TABS Take 1 tablet by mouth daily.   Yes Historical Provider, MD  glipiZIDE (GLUCOTROL) 5 MG tablet Take 5 mg by mouth 2 (two) times daily before a meal.   Yes Historical Provider, MD  lisinopril-hydrochlorothiazide (PRINZIDE,ZESTORETIC) 10-12.5 MG per tablet Take 1 tablet by mouth daily.   Yes Historical Provider, MD  metFORMIN (GLUCOPHAGE) 500 MG tablet Take 1,000 mg  by mouth 2 (two) times daily with a meal.    Yes Historical Provider, MD  sertraline (ZOLOFT) 100 MG tablet Take 100 mg by mouth daily.    Yes Historical Provider, MD   BP 93/73  Pulse 94  Temp(Src) 102.7 F (39.3 C) (Rectal)  Resp 15  Wt 239 lb (108.41 kg)  SpO2 98%  Physical Exam  Nursing note and vitals reviewed. Constitutional: He is oriented to person, place, and time. He appears  well-developed and well-nourished. No distress.  Nontoxic/nonseptic appearing  HENT:  Head: Normocephalic and atraumatic.  Eyes: Conjunctivae and EOM are normal. No scleral icterus.  Neck: Normal range of motion.  Cardiovascular: Normal rate, regular rhythm and normal heart sounds.   Pulmonary/Chest: Effort normal and breath sounds normal. No respiratory distress. He has no wheezes. He has no rales.  Chest expansion symmetric. No tachypnea or dyspnea.  Abdominal: Soft. He exhibits no mass. There is tenderness. There is no rebound and no guarding.  Positive R CVA TTP. Mild right sided suprapubic TTP. No masses or peritoneal signs. No TTP at McBurney's point.  Musculoskeletal: Normal range of motion.  Neurological: He is alert and oriented to person, place, and time. He exhibits normal muscle tone.  GCS 15. Speech is goal oriented.  Skin: Skin is warm and dry. No rash noted. He is not diaphoretic. No erythema. No pallor.  Psychiatric: He has a normal mood and affect. His behavior is normal.    ED Course  Procedures (including critical care time) Labs Review Labs Reviewed  CBC WITH DIFFERENTIAL - Abnormal; Notable for the following:    WBC 12.9 (*)    Neutrophils Relative % 87 (*)    Neutro Abs 11.1 (*)    Lymphocytes Relative 8 (*)    All other components within normal limits  COMPREHENSIVE METABOLIC PANEL - Abnormal; Notable for the following:    Sodium 134 (*)    Chloride 94 (*)    Glucose, Bld 202 (*)    BUN 26 (*)    Creatinine, Ser 1.63 (*)    GFR calc non Af Amer 41 (*)    GFR calc Af Amer 48 (*)    All other components within normal limits  URINALYSIS, ROUTINE W REFLEX MICROSCOPIC - Abnormal; Notable for the following:    Color, Urine AMBER (*)    APPearance CLOUDY (*)    Hgb urine dipstick SMALL (*)    Protein, ur 30 (*)    Leukocytes, UA LARGE (*)    All other components within normal limits  URINE CULTURE  CULTURE, BLOOD (ROUTINE X 2)  CULTURE, BLOOD (ROUTINE X  2)  LIPASE, BLOOD  URINE MICROSCOPIC-ADD ON    Imaging Review Ct Abdomen Pelvis Wo Contrast  08/20/2014   CLINICAL DATA:  Right flank pain  EXAM: CT ABDOMEN AND PELVIS WITHOUT CONTRAST  TECHNIQUE: Multidetector CT imaging of the abdomen and pelvis was performed following the standard protocol without IV contrast.  COMPARISON:  None.  FINDINGS: Lower chest: There are multiple small nodules identified in both lungs. Index nodule in the right middle lobe measures 5 mm, image 9 of series 203. Lingular nodule measures 4 mm, image 13/ series 203. No pleural effusion noted  Hepatobiliary: Diffuse fatty infiltration of the liver. The gallbladder is normal. There is no biliary dilatation. Normal appearance of the pancreas.  Pancreas: Normal appearance of the pancreas.  Spleen: The spleen is on unremarkable.  Adrenals/Urinary Tract: The adrenal glands are both normal. 3 mm nonobstructing  calculus is identified within the upper pole the left kidney. Low-attenuation structure in the left kidney measures 1.8 cm and is incompletely characterized without IV contrast. There is a 5 mm stone within the lower pole of the right kidney. At the right UPJ there is a 1.2 by 1.1 cm stone. This causes mild right hydronephrosis. The urinary bladder appears normal.  Stomach/Bowel: The stomach is normal. The small bowel loops have a normal course and caliber. No obstruction. Normal appearance of the colon.  Vascular/Lymphatic: Calcified atherosclerotic disease involves the abdominal aorta. There is no aneurysm. There is no retroperitoneal adenopathy identified no pelvic or inguinal adenopathy noted.  Reproductive: Prostate gland and seminal vesicles appear normal.  Other: There is no free fluid or fluid collections identified within the abdomen or pelvis.  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 There are bilateral L5  pars defects identified. Anterolisthesis of L5 on S1 appears normal.  IMPRESSION: 1. Large stone is identified at the right UPJ. This causes right-sided hydronephrosis. Smaller nonobstructing calculus is noted within the inferior pole collecting system of the right kidney. 2. Advanced lumbar spondylosis. 3. There are multiple indeterminate nodules identified within the lung bases. Consider followup common nonemergent CT of the chest for further investigation.   Electronically Signed   By: Signa Kellaylor  Stroud M.D.   On: 08/20/2014 22:02     EKG Interpretation None      MDM   Final diagnoses:  Right flank pain  Sepsis  Ureterolithiasis  UTI (lower urinary tract infection)    68 y/o male presents for R flank pain. Rectal temp 102.73F on arrival. Patient noted to be mildly tachycardic. Clinical picture suggestive of kidney stone. CT ordered which shows 1.1x1.2cm stone at R UPJ. Suspect infected stone as source of sepsis. UA with 11-20 WBCs; culture in process. Rocephin started and Dr. Vernie Ammonsttelin consulted. He will follow patient and has recommended hospitalist admission [facility]. Case discussed with Dr. Elisabeth Pigeonevine of [facility]. [facility] further sepsis and kidney stone management. EMTALA completed for transfer.   Filed Vitals:   08/20/14 1954 08/20/14 2009 08/20/14 2116 08/20/14 2151  BP: 113/78  93/73   Pulse: 94     Temp:  99.1 F (37.3 C)  102.7 F (39.3 C)  TempSrc:    Rectal  Resp: 20  15   Weight: 239 lb (108.41 kg)     SpO2: 98%         Antony MaduraKelly Denean Pavon, PA-C 08/20/14 2241

## 2014-08-20 NOTE — H&P (Signed)
[facility] History and Physical  Levon Zechman ATF:[id number] DOB: 1946-09-21 DOA: 08/20/2014  Referring physician: ER physician PCP: Marjorie Smolder, MD   Chief Complaint: Flank pain  HPI:  68 year old male with past medical history of diabetes, hypertension, Parkinson's disease, obesity who presented to [facility] ED 08/20/2014 with worsening pain in right flank area for past few days prior to this admission and worsening over past 24 hours. Pain is sharp, constant, 8 out of 10 in intensity, nonradiating. Patient reported associated nausea, nonbloody vomiting, poor by mouth intake. Patient also reported associated fever at home. No complaints of chest pain, shortness of breath or palpitations. No complaints of diarrhea or constipation or blood in the stool. No lightheadedness or loss of consciousness. In ED, blood pressure was 87/69, heart rate 107, RR 26, T max 102.7 F and oxygen saturation 94% on room air. Blood work revealed leukocytosis of 12.9, creatinine of 1.63.  CT abdomen without contrast showed large stone in right UPJ causing right-sided hydronephrosis, smaller nonobstructing calculus noted within the inferior pole collecting system of the right kidney. Multiple other indeterminate nodules were identified within the lung bases and the recommendation is to do nonemergent CT of the chest for further investigation. GU was consulted and per their recommendation we will transfer the patient to [facility] in case surgery may be required. Patient was given Rocephin in the ED for treatment of urinary tract infection and he tolerated the medication well.   Assessment & Plan    Principal Problem:   Sepsis secondary to urinary tract infection in the setting of right UPJ stone and resultant right side hydronephrosis /Leukocytosis   Sepsis criteria met on the admission with initial vitals that included blood pressure 87/69, tachycardia, tachypnea, fever and evidence of infection based on  urinalysis.  Patient was given dose of Rocephin in the ER and tolerated it well. We will continue this antibiotic.  Follow-up urine culture results.  Continue supportive care with IV fluids, analgesia and antiemetics as needed.  Appreciate a GU consult for recommendations. Active Problems:   Obstruction of right ureteropelvic junction (UPJ) due to stone  Per GU, nonemergent, prefer to transfer to [facility] long hospital. Order placed for transfer.   Acute renal failure  Likely secondary to right UPJ stone, obstruction. Patient is on lisinopril and metformin which could also contribute to acute renal failure. We held those medications on admission.  Continue IV fluids. Follow-up BMP in the morning.   Essential hypertension  Hold antihypertensive to 2 soft blood pressure.   Parkinson's disease  Resume carbidopa   DVT prophylaxis:   SCD's bilaterally  due to possible surgery   Radiological Exams on Admission: Ct Abdomen Pelvis Wo Contrast 08/20/2014   1. Large stone is identified at the right UPJ. This causes right-sided hydronephrosis. Smaller nonobstructing calculus is noted within the inferior pole collecting system of the right kidney. 2. Advanced lumbar spondylosis. 3. There are multiple indeterminate nodules identified within the lung bases. Consider followup common nonemergent CT of the chest for further investigation.     Code Status: Full Family Communication: Plan of care discussed with the patient  Disposition Plan: Admit for further evaluation  Leisa Lenz, MD  [facility] Pager [phone]  Review of Systems:  Constitutional: Negative for fever, chills and malaise/fatigue. Negative for diaphoresis.  HENT: Negative for hearing loss, ear pain, nosebleeds, congestion, sore throat, neck pain, tinnitus and ear discharge.   Eyes: Negative for blurred vision, double vision, photophobia, pain, discharge and redness.  Respiratory:  Negative for cough, hemoptysis, sputum  production, shortness of breath, wheezing and stridor.   Cardiovascular: Negative for chest pain, palpitations, orthopnea, claudication and leg swelling.  Gastrointestinal:  Negative for heartburn, constipation, blood in stool and melena.  Genitourinary: per HPI   Musculoskeletal: Negative for myalgias, back pain, joint pain and falls.  Skin: Negative for itching and rash.  Neurological: Negative for dizziness and weakness. Negative for tingling, tremors, sensory change, speech change, focal weakness, loss of consciousness and headaches.  Endo/Heme/Allergies: Negative for environmental allergies and polydipsia. Does not bruise/bleed easily.  Psychiatric/Behavioral: Negative for suicidal ideas. The patient is not nervous/anxious.      Past Medical History  Diagnosis Date  . Back pain     lumbar  . Urgency of urination   . Hammer toe   . Diabetes mellitus   . Unspecified essential hypertension   . Parkinson disease    Past Surgical History  Procedure Laterality Date  . Toe surgery Right     big toe  . Toe surgery Left     2nd toe   Social History:  reports that he has never smoked. He does not have any smokeless tobacco history on file. He reports that he does not drink alcohol or use illicit drugs.  Allergies  Allergen Reactions  . Amoxapine And Related Swelling  . Amoxicillin Swelling    Family History:  Family History  Problem Relation Age of Onset  . High blood pressure Mother   . Diabetes Mother   . Stroke Father      Prior to Admission medications   Medication Sig Start Date End Date Taking? Authorizing Provider  atenolol (TENORMIN) 25 MG tablet Take 25 mg by mouth daily.   Yes Historical Provider, MD  carbidopa-levodopa-entacapone (STALEVO) 50-200-200 MG per tablet Take 1 tablet by mouth 4 (four) times daily. At 6am, 10am, 2pm, and 6pm 01/22/13  Yes Marcial Pacas, MD  Cholecalciferol (VITAMIN D3) 3000 UNITS TABS Take 1 tablet by mouth daily.   Yes Historical Provider,  MD  glipiZIDE (GLUCOTROL) 5 MG tablet Take 5 mg by mouth 2 (two) times daily before a meal.   Yes Historical Provider, MD  lisinopril-hydrochlorothiazide (PRINZIDE,ZESTORETIC) 10-12.5 MG per tablet Take 1 tablet by mouth daily.   Yes Historical Provider, MD  metFORMIN (GLUCOPHAGE) 500 MG tablet Take 1,000 mg by mouth 2 (two) times daily with a meal.    Yes Historical Provider, MD  sertraline (ZOLOFT) 100 MG tablet Take 100 mg by mouth daily.    Yes Historical Provider, MD   Physical Exam: Filed Vitals:   08/20/14 2151 08/20/14 2155 08/20/14 2201 08/20/14 2250  BP:  100/64 87/69   Pulse:  107 104   Temp: 102.7 F (39.3 C)   101.7 F (38.7 C)  TempSrc: Rectal   Rectal  Resp:  19 26   Weight:      SpO2:  94% 95%     Physical Exam  Constitutional: Appears well-developed and well-nourished. No distress.  HENT: Normocephalic. No tonsillar erythema or exudates Eyes: Conjunctivae and EOM are normal. PERRLA, no scleral icterus.  Neck: Normal ROM. Neck supple. No JVD. No tracheal deviation. No thyromegaly.  CVS: tachcyardic, S1/S2 appreciated  Pulmonary: Effort and breath sounds normal, no stridor, rhonchi, wheezes, rales.  Abdominal: tenderness in flank area, right side, abdomen tender in mid abd area, no rebound or guarding.  Musculoskeletal: Normal range of motion. No edema and no tenderness.  Lymphadenopathy: No lymphadenopathy noted, cervical, inguinal. Neuro: Alert. Normal reflexes,  muscle tone coordination. No focal neurologic deficits. Skin: Skin is warm and dry. No rash noted. Not diaphoretic. No erythema. No pallor.  Psychiatric: Normal mood and affect. Behavior, judgment, thought content normal.   Labs on Admission:  Basic Metabolic Panel:  Recent Labs Lab 08/20/14 2003  NA 134*  K 4.0  CL 94*  CO2 25  GLUCOSE 202*  BUN 26*  CREATININE 1.63*  CALCIUM 9.1   Liver Function Tests:  Recent Labs Lab 08/20/14 2003  AST 27  ALT 6  ALKPHOS 68  BILITOT 1.1  PROT 7.5   ALBUMIN 4.1    Recent Labs Lab 08/20/14 2003  LIPASE 22   No results found for this basename: AMMONIA,  in the last 168 hours CBC:  Recent Labs Lab 08/20/14 2003  WBC 12.9*  NEUTROABS 11.1*  HGB 14.3  HCT 42.5  MCV 88.2  PLT 184   Cardiac Enzymes: No results found for this basename: CKTOTAL, CKMB, CKMBINDEX, TROPONINI,  in the last 168 hours BNP: No components found with this basename: POCBNP,  CBG: No results found for this basename: GLUCAP,  in the last 168 hours  If 7PM-7AM, please contact night-coverage [email or web address] Password [email or web address] 08/20/2014, 11:26 PM

## 2014-08-20 NOTE — ED Notes (Signed)
The pt is c/o abd nd flank p;ain since 1400 today.  He reports that he is constipated.  [place].  Hx of kidney stones

## 2014-08-20 NOTE — ED Provider Notes (Signed)
Pt is a 68 y.o. M with history of diabetes, hypertension, Parkinson's disease who presents to the emergency department with right flank pain, fever. Patient has a leukocytosis, elevated creatinine, UTI. He also has a large ureterolithiasis. Urology has been consult. Medicine will admit. [facility]  Layla MawKristen N Quency Tober, DO 08/20/14 2247

## 2014-08-21 DIAGNOSIS — D696 Thrombocytopenia, unspecified: Secondary | ICD-10-CM

## 2014-08-21 DIAGNOSIS — N179 Acute kidney failure, unspecified: Secondary | ICD-10-CM

## 2014-08-21 DIAGNOSIS — D72829 Elevated white blood cell count, unspecified: Secondary | ICD-10-CM

## 2014-08-21 DIAGNOSIS — N201 Calculus of ureter: Secondary | ICD-10-CM

## 2014-08-21 LAB — URINALYSIS, ROUTINE W REFLEX MICROSCOPIC
Bilirubin Urine: NEGATIVE
GLUCOSE, UA: NEGATIVE mg/dL
Ketones, ur: NEGATIVE mg/dL
Nitrite: POSITIVE — AB
PH: 5.5 (ref 5.0–8.0)
Protein, ur: 100 mg/dL — AB
SPECIFIC GRAVITY, URINE: 1.022 (ref 1.005–1.030)
Urobilinogen, UA: 0.2 mg/dL (ref 0.0–1.0)

## 2014-08-21 LAB — CBC
HCT: 40.9 % (ref 39.0–52.0)
Hemoglobin: 13.6 g/dL (ref 13.0–17.0)
MCH: 29.9 pg (ref 26.0–34.0)
MCHC: 33.3 g/dL (ref 30.0–36.0)
MCV: 89.9 fL (ref 78.0–100.0)
PLATELETS: 120 10*3/uL — AB (ref 150–400)
RBC: 4.55 MIL/uL (ref 4.22–5.81)
RDW: 13.5 % (ref 11.5–15.5)
WBC: 9.2 10*3/uL (ref 4.0–10.5)

## 2014-08-21 LAB — COMPREHENSIVE METABOLIC PANEL
ALK PHOS: 65 U/L (ref 39–117)
ALT: 20 U/L (ref 0–53)
AST: 27 U/L (ref 0–37)
Albumin: 3.3 g/dL — ABNORMAL LOW (ref 3.5–5.2)
Anion gap: 19 — ABNORMAL HIGH (ref 5–15)
BUN: 35 mg/dL — ABNORMAL HIGH (ref 6–23)
CO2: 18 mEq/L — ABNORMAL LOW (ref 19–32)
Calcium: 8.3 mg/dL — ABNORMAL LOW (ref 8.4–10.5)
Chloride: 99 mEq/L (ref 96–112)
Creatinine, Ser: 2.57 mg/dL — ABNORMAL HIGH (ref 0.50–1.35)
GFR calc Af Amer: 28 mL/min — ABNORMAL LOW (ref >90)
GFR calc non Af Amer: 24 mL/min — ABNORMAL LOW (ref >90)
Glucose, Bld: 167 mg/dL — ABNORMAL HIGH (ref 70–99)
POTASSIUM: 3.9 meq/L (ref 3.7–5.3)
SODIUM: 136 meq/L — AB (ref 137–147)
TOTAL PROTEIN: 6 g/dL (ref 6.0–8.3)
Total Bilirubin: 1 mg/dL (ref 0.3–1.2)

## 2014-08-21 LAB — BASIC METABOLIC PANEL
Anion gap: 19 — ABNORMAL HIGH (ref 5–15)
BUN: 35 mg/dL — ABNORMAL HIGH (ref 6–23)
CALCIUM: 8.1 mg/dL — AB (ref 8.4–10.5)
CO2: 19 mEq/L (ref 19–32)
Chloride: 102 mEq/L (ref 96–112)
Creatinine, Ser: 2.59 mg/dL — ABNORMAL HIGH (ref 0.50–1.35)
GFR calc Af Amer: 27 mL/min — ABNORMAL LOW (ref >90)
GFR, EST NON AFRICAN AMERICAN: 24 mL/min — AB (ref >90)
GLUCOSE: 166 mg/dL — AB (ref 70–99)
POTASSIUM: 4 meq/L (ref 3.7–5.3)
SODIUM: 140 meq/L (ref 137–147)

## 2014-08-21 LAB — FIBRINOGEN: Fibrinogen: 470 mg/dL (ref 204–475)

## 2014-08-21 LAB — CBG MONITORING, ED: Glucose-Capillary: 202 mg/dL — ABNORMAL HIGH (ref 70–99)

## 2014-08-21 LAB — APTT: APTT: 37 s (ref 24–37)

## 2014-08-21 LAB — URINE MICROSCOPIC-ADD ON

## 2014-08-21 LAB — GLUCOSE, CAPILLARY
GLUCOSE-CAPILLARY: 161 mg/dL — AB (ref 70–99)
Glucose-Capillary: 177 mg/dL — ABNORMAL HIGH (ref 70–99)
Glucose-Capillary: 199 mg/dL — ABNORMAL HIGH (ref 70–99)

## 2014-08-21 LAB — PROTIME-INR
INR: 1.39 (ref 0.00–1.49)
Prothrombin Time: 17.2 seconds — ABNORMAL HIGH (ref 11.6–15.2)

## 2014-08-21 MED ORDER — SODIUM CHLORIDE 0.9 % IJ SOLN
3.0000 mL | Freq: Two times a day (BID) | INTRAMUSCULAR | Status: DC
  Administered 2014-08-21 – 2014-08-25 (×4): 3 mL via INTRAVENOUS

## 2014-08-21 MED ORDER — ONDANSETRON HCL 4 MG PO TABS: 4.0000 mg | ORAL_TABLET | Freq: Four times a day (QID) | ORAL | Status: DC | PRN

## 2014-08-21 MED ORDER — ACETAMINOPHEN 650 MG RE SUPP
650.0000 mg | Freq: Four times a day (QID) | RECTAL | Status: DC | PRN
  Filled 2014-08-21 (×2): qty 1

## 2014-08-21 MED ORDER — SODIUM CHLORIDE 0.9 % IV SOLN
INTRAVENOUS | Status: AC
  Administered 2014-08-21: 11:00:00 via INTRAVENOUS

## 2014-08-21 MED ORDER — GLIPIZIDE 5 MG PO TABS
5.0000 mg | ORAL_TABLET | Freq: Two times a day (BID) | ORAL | Status: DC
  Filled 2014-08-21 (×3): qty 1

## 2014-08-21 MED ORDER — CARBIDOPA-LEVODOPA-ENTACAPONE 50-200-200 MG PO TABS: 1.0000 | ORAL_TABLET | Freq: Four times a day (QID) | ORAL | Status: DC

## 2014-08-21 MED ORDER — MORPHINE SULFATE 4 MG/ML IJ SOLN
2.0000 mg | INTRAMUSCULAR | Status: DC | PRN
  Administered 2014-08-21 – 2014-08-23 (×8): 2 mg via INTRAVENOUS
  Filled 2014-08-21 (×10): qty 1

## 2014-08-21 MED ORDER — DEXTROSE 5 % IV SOLN
1.0000 g | Freq: Every day | INTRAVENOUS | Status: DC
  Administered 2014-08-21: 1 g via INTRAVENOUS
  Filled 2014-08-21: qty 10

## 2014-08-21 MED ORDER — ENTACAPONE 200 MG PO TABS
200.0000 mg | ORAL_TABLET | ORAL | Status: DC
Start: 2014-08-21 — End: 2014-08-25
  Administered 2014-08-21 – 2014-08-25 (×16): 200 mg via ORAL
  Filled 2014-08-21 (×25): qty 1

## 2014-08-21 MED ORDER — VITAMIN D 1000 UNITS PO TABS
3000.0000 [IU] | ORAL_TABLET | Freq: Every day | ORAL | Status: DC
  Administered 2014-08-21 – 2014-08-25 (×5): 3000 [IU] via ORAL
  Filled 2014-08-21 (×6): qty 3

## 2014-08-21 MED ORDER — INSULIN ASPART 100 UNIT/ML ~~LOC~~ SOLN
0.0000 [IU] | Freq: Every day | SUBCUTANEOUS | Status: DC
Start: 2014-08-21 — End: 2014-08-25
  Administered 2014-08-24: 2 [IU] via SUBCUTANEOUS

## 2014-08-21 MED ORDER — LORAZEPAM 2 MG/ML IJ SOLN
INTRAMUSCULAR | Status: AC
  Filled 2014-08-21: qty 1

## 2014-08-21 MED ORDER — INSULIN ASPART 100 UNIT/ML ~~LOC~~ SOLN
0.0000 [IU] | Freq: Three times a day (TID) | SUBCUTANEOUS | Status: DC
  Administered 2014-08-21 (×3): 2 [IU] via SUBCUTANEOUS
  Administered 2014-08-22: 1 [IU] via SUBCUTANEOUS
  Administered 2014-08-23: 2 [IU] via SUBCUTANEOUS
  Administered 2014-08-23 (×2): 1 [IU] via SUBCUTANEOUS
  Administered 2014-08-24 (×2): 2 [IU] via SUBCUTANEOUS
  Administered 2014-08-25: 7 [IU] via SUBCUTANEOUS

## 2014-08-21 MED ORDER — ACETAMINOPHEN 325 MG PO TABS
650.0000 mg | ORAL_TABLET | Freq: Four times a day (QID) | ORAL | Status: DC | PRN
  Filled 2014-08-21: qty 2

## 2014-08-21 MED ORDER — SERTRALINE HCL 100 MG PO TABS
100.0000 mg | ORAL_TABLET | Freq: Every day | ORAL | Status: DC
  Administered 2014-08-21 – 2014-08-25 (×5): 100 mg via ORAL
  Filled 2014-08-21 (×5): qty 1

## 2014-08-21 MED ORDER — CARBIDOPA-LEVODOPA 25-100 MG PO TABS
2.0000 | ORAL_TABLET | ORAL | Status: DC
  Administered 2014-08-21 – 2014-08-25 (×16): 2 via ORAL
  Filled 2014-08-21 (×25): qty 2

## 2014-08-21 MED ORDER — ONDANSETRON HCL 4 MG/2ML IJ SOLN
4.0000 mg | Freq: Four times a day (QID) | INTRAMUSCULAR | Status: DC | PRN
  Administered 2014-08-22: 4 mg via INTRAVENOUS
  Filled 2014-08-21 (×2): qty 2

## 2014-08-21 NOTE — Progress Notes (Signed)
Pt arrived to floor room 1517 via stretcher. VS taken pt oriented to room with no complications. Pain 0/10 A and OX4. Gait unsteady general weakness. Will continue to monitor this shift

## 2014-08-21 NOTE — Progress Notes (Signed)
PROGRESS NOTE  March Steyer VHQ:[id number] DOB: 1946-06-19 DOA: 08/20/2014 PCP: [name], MD  Brief history 68 year old male with a history of diabetes mellitus, hypertension, Parkinson's disease presents with one-day history of right flank pain that developed in the early morning hours of 08/19/2014. The patient had some subjective fever and chills with progressive right flank pain with associated nausea and vomiting. As a result, he presented to the emergency department where the patient was found have temperature 102.22F. CT of the abdomen and pelvis revealed a 1.2 cm right UPJ stone.  The patient had WBC 12.9 with serum creatinine of 1.63 at the time of presentation. The patient was started on ceftriaxone after urine and blood cultures were obtained. Urinalysis shows 11-20 WBC. Assessment/Plan: AKI -Baseline creatinine 0.9-1.1 -Likely multifactorial including volume depletion in the setting of  ACEi/HCTZ, NSAIDs/toradol (in ED), and obstructive uropathy -patient also took 9 tablets of ibuprofen on the day he came to hospital -Discontinue NSAIDs, ACEi, HCTZ -Judicious hydration -Avoid nephrotoxic agents -Monitor renal function Nephrolithiasis/obstructive uropathy -Appreciate Dr. Vernie Ammons consult -case discussed with Dr. Vernie Ammons -continue abx pending culture data -08/20/2014 CT abdomen and pelvis--R-UPJ stone -Vomiting is improved Diabetes mellitus type 2 -Discontinue glipizide -NovoLog sliding scale -Hemoglobin A1c -d/c metformin/glipizide Hypertension -Hold atenolol, lisinopril, HCTZ as blood pressure is soft Parkinson's disease -continue Stalevo Depression -Continue Zoloft Pulmonary nodules -Incidental finding on CT of the abdomen and pelvis -Will need outpatient surveillance Thrombocytopenia -Patient appears to have mild thrombocytopenia on previous labs -Check INR, PTT, fibrinogen  Family Communication:   Pt at beside Disposition Plan:   Home when  medically stable  Antibiotics:  Ceftriaxone 08/20/2014>>>    Procedures/Studies: Ct Abdomen Pelvis Wo Contrast  08/20/2014   CLINICAL DATA:  Right flank pain  EXAM: CT ABDOMEN AND PELVIS WITHOUT CONTRAST  TECHNIQUE: Multidetector CT imaging of the abdomen and pelvis was performed following the standard protocol without IV contrast.  COMPARISON:  None.  FINDINGS: Lower chest: There are multiple small nodules identified in both lungs. Index nodule in the right middle lobe measures 5 mm, image 9 of series 203. Lingular nodule measures 4 mm, image 13/ series 203. No pleural effusion noted  Hepatobiliary: Diffuse fatty infiltration of the liver. The gallbladder is normal. There is no biliary dilatation. Normal appearance of the pancreas.  Pancreas: Normal appearance of the pancreas.  Spleen: The spleen is on unremarkable.  Adrenals/Urinary Tract: The adrenal glands are both normal. 3 mm nonobstructing calculus is identified within the upper pole the left kidney. Low-attenuation structure in the left kidney measures 1.8 cm and is incompletely characterized without IV contrast. There is a 5 mm stone within the lower pole of the right kidney. At the right UPJ there is a 1.2 by 1.1 cm stone. This causes mild right hydronephrosis. The urinary bladder appears normal.  Stomach/Bowel: The stomach is normal. The small bowel loops have a normal course and caliber. No obstruction. Normal appearance of the colon.  Vascular/Lymphatic: Calcified atherosclerotic disease involves the abdominal aorta. There is no aneurysm. There is no retroperitoneal adenopathy identified no pelvic or inguinal adenopathy noted.  Reproductive: Prostate gland and seminal vesicles appear normal.  Other: There is no free fluid or fluid collections identified within the abdomen or pelvis.  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  There are bilateral L5 pars defects identified. Anterolisthesis of L5 on S1 appears  normal.  IMPRESSION: 1. Large stone is identified at the right UPJ. This causes right-sided hydronephrosis. Smaller nonobstructing calculus is noted within the inferior pole collecting system of the right kidney. 2. Advanced lumbar spondylosis. 3. There are multiple indeterminate nodules identified within the lung bases. Consider followup common nonemergent CT of the chest for further investigation.   Electronically Signed   By: Signa Kellaylor  Stroud M.D.   On: 08/20/2014 22:02   Dg Chest 2 View  08/21/2014   CLINICAL DATA:  Right-sided flank pain with fever.  Kidney stone.  EXAM: CHEST  2 VIEW  COMPARISON:  01/02/2014  FINDINGS: Normal heart size. No pleural effusion identified. There is no airspace consolidation. Asymmetric elevation of the right hemidiaphragm is identified. No pleural effusion or edema identified. There is no airspace consolidation. Spondylosis identified within the thoracic spine.  IMPRESSION: 1. No acute findings. 2. Asymmetric elevation of right hemidiaphragm.   Electronically Signed   By: Signa Kellaylor  Stroud M.D.   On: 08/21/2014 00:06         Subjective: Vomiting has improved as well as abdominal pain. Denies any fevers, chills, chest pain, shortness breath, nausea, vomiting, diarrhea. No dysuria.  Objective: Filed Vitals:   08/20/14 2333 08/20/14 2334 08/20/14 2349 08/21/14 0514  BP: 97/54  97/65 102/61  Pulse:  100 100 119  Temp:   99.2 F (37.3 C) 98.1 F (36.7 C)  TempSrc:    Oral  Resp:  25 22 20   Height:    5\' 11"  (1.803 m)  Weight:    108.6 kg (239 lb 6.7 oz)  SpO2:  94% 94% 92%   No intake or output data in the 24 hours ending 08/21/14 1042 Weight change:  Exam:   General:  Pt is alert, follows commands appropriately, not in acute distress  HEENT: No icterus, No thrush,  [place]/AT  Cardiovascular: RRR, S1/S2, no rubs, no gallops  Respiratory: right basilar crackles, left clear  to auscultation. No wheezing  Abdomen: Soft/+BS, non tender, non distended, no guarding  Extremities: No edema, No lymphangitis, No petechiae, No rashes, no synovitis  Data Reviewed: Basic Metabolic Panel:  Recent Labs Lab 08/20/14 2003 08/21/14 0723  NA 134* 136*  140  K 4.0 3.9  4.0  CL 94* 99  102  CO2 25 18*  19  GLUCOSE 202* 167*  166*  BUN 26* 35*  35*  CREATININE 1.63* 2.57*  2.59*  CALCIUM 9.1 8.3*  8.1*   Liver Function Tests:  Recent Labs Lab 08/20/14 2003 08/21/14 0723  AST 27 27  ALT 6 20  ALKPHOS 68 65  BILITOT 1.1 1.0  PROT 7.5 6.0  ALBUMIN 4.1 3.3*    Recent Labs Lab 08/20/14 2003  LIPASE 22   No results for input(s): AMMONIA in the last 168 hours. CBC:  Recent Labs Lab 08/20/14 2003 08/21/14 0723  WBC 12.9* 9.2  NEUTROABS 11.1*  --   HGB 14.3 13.6  HCT 42.5 40.9  MCV 88.2 89.9  PLT 184 120*   Cardiac Enzymes: No results for input(s): CKTOTAL, CKMB, CKMBINDEX, TROPONINI in the last 168 hours. BNP: Invalid input(s): POCBNP CBG:  Recent Labs Lab 08/21/14 0326  GLUCAP 202*    No results found for this or any previous visit (from the past 240 hour(s)).   Scheduled Meds: . carbidopa-levodopa  2 tablet Oral 4 times per day   And  . entacapone  200 mg Oral 4 times per day  . cefTRIAXone (ROCEPHIN)  IV  1 g  Intravenous QHS  . cholecalciferol  3,000 Units Oral Daily  . insulin aspart  0-5 Units Subcutaneous QHS  . insulin aspart  0-9 Units Subcutaneous TID WC  . LORazepam      . sertraline  100 mg Oral Daily  . sodium chloride  3 mL Intravenous Q12H   Continuous Infusions: . sodium chloride       Poonam Woehrle, DO  [facility] Pager [phone]  If 7PM-7AM, please contact night-coverage [email or web address] Password [email or web address] 08/21/2014, 10:42 AM   LOS: 1 day

## 2014-08-21 NOTE — Consult Note (Signed)
Urology Consult  CC: Referring physician: Dr. Ashok PallA Devine Reason for referral: right UPJ stone  History of Present Illness: Cody [name] is a 68 year old male with a history of calculus disease having passed 2 stones spontaneously in the past. He developed progressively worse pain in his right flank region that did not radiate into the abdomen. It was not associated with any gross hematuria but was associated with nausea, vomiting and decreased appetite. This occurred over the course of 48 hour period and he reported having subjective fever at home. He presented to the emergency room and a where he was found to have a fever of 102. A CT scan was obtained which revealed a 1.2 cm stone located at the right UPJ. He reports that he has never had a urinary tract infection to his knowledge. In the emergency room he was found to have a urinalysis that had no nitrites but was positive for leukocyte esterase. There were 11-20 white blood cells and 3-6 red blood cells but no bacteria were reported. His white count was minimally elevated at 12.9. I was contacted regarding his stone and fever and recommended [facility] to [facility] in case a stent was necessary however I did not feel that that was indicated.  Past Medical History  Diagnosis Date  . Back pain     lumbar  . Urgency of urination   . Hammer toe   . Diabetes mellitus   . Unspecified essential hypertension   . Parkinson disease    Past Surgical History  Procedure Laterality Date  . Toe surgery Right     big toe  . Toe surgery Left     2nd toe    Medications:  Prior to Admission:  Prescriptions prior to admission  Medication Sig Dispense Refill Last Dose  . atenolol (TENORMIN) 25 MG tablet Take 25 mg by mouth daily.   08/20/2014 at Unknown time  . carbidopa-levodopa-entacapone (STALEVO) 50-200-200 MG per tablet Take 1 tablet by mouth 4 (four) times daily. At 6am, 10am, 2pm, and 6pm   08/20/2014 at  Unknown time  . Cholecalciferol (VITAMIN D3) 3000 UNITS TABS Take 1 tablet by mouth daily.   08/20/2014 at Unknown time  . glipiZIDE (GLUCOTROL) 5 MG tablet Take 5 mg by mouth 2 (two) times daily before a meal.   08/20/2014 at Unknown time  . lisinopril-hydrochlorothiazide (PRINZIDE,ZESTORETIC) 10-12.5 MG per tablet Take 1 tablet by mouth daily.   08/20/2014 at Unknown time  . metFORMIN (GLUCOPHAGE) 500 MG tablet Take 1,000 mg by mouth 2 (two) times daily with a meal.    08/20/2014 at Unknown time  . sertraline (ZOLOFT) 100 MG tablet Take 100 mg by mouth daily.    08/20/2014 at Unknown time    Allergies:  Allergies  Allergen Reactions  . Amoxapine And Related Swelling  . Amoxicillin Swelling    Family History  Problem Relation Age of Onset  . High blood pressure Mother   . Diabetes Mother   . Stroke Father     Social History:  reports that he has never smoked. He does not have any smokeless tobacco history on file. He reports that he does not drink alcohol or use illicit drugs.  Review of Systems: Pertinent items are noted in HPI. A comprehensive review of systems was negative except as noted above  Physical Exam:  Vital signs in last 24 hours: Temp:  [98.1 F (36.7 C)-102.7 F (39.3 C)] 98.1 F (36.7 C) (11/01  16100514) Pulse Rate:  [94-119] 119 (11/01 0514) Resp:  [15-26] 20 (11/01 0514) BP: (87-113)/(54-78) 102/61 mmHg (11/01 0514) SpO2:  [92 %-98 %] 92 % (11/01 0514) Weight:  [108.41 kg (239 lb)-108.6 kg (239 lb 6.7 oz)] 108.6 kg (239 lb 6.7 oz) (11/01 0514) General appearance: alert and appears stated age in no apparent distress Head: Normocephalic, without obvious abnormality, atraumatic Eyes: conjunctivae/corneas clear. EOM's intact.  Oropharynx: moist mucous membranes Neck: supple, symmetrical, trachea midline Resp: normal respiratory effort Cardio: regular rate and rhythm Back: symmetric, no curvature. ROM normal. Minimal right CVA tenderness. GI: soft, non-tender;  bowel sounds normal; no masses,  no organomegaly Male genitalia: penis: normal male phallus with no lesions or discharge.Testes: bilaterally descended with no masses or tenderness. no hernias Extremities: extremities normal, atraumatic, no cyanosis or edema Skin: Skin color normal. No visible rashes or lesions Neurologic: Grossly normal  Laboratory Data:   Recent Labs  08/20/14 2003 08/21/14 0723  WBC 12.9* 9.2  HGB 14.3 13.6  HCT 42.5 40.9   BMET  Recent Labs  08/20/14 2003  NA 134*  K 4.0  CL 94*  CO2 25  GLUCOSE 202*  BUN 26*  CREATININE 1.63*  CALCIUM 9.1   No results for input(s): LABPT, INR in the last 72 hours. No results for input(s): LABURIN in the last 72 hours. Results for orders placed or performed during the hospital encounter of 01/02/14  Culture, blood (routine x 2)     Status: None   Collection Time: 01/02/14 11:43 PM  Result Value Ref Range Status   Specimen Description BLOOD RIGHT ARM  Final   Special Requests BOTTLES DRAWN AEROBIC AND ANAEROBIC [facility]5CC EACH  Final   Culture  Setup Time   Final    01/03/2014 08:24 Performed at [facility]   Culture   Final    KLEBSIELLA OXYTOCA Note: Gram Stain Report Called to,Read Back By and Verified With: Judee ClaraRIMAINE BUTLER 01/04/14 0452A FULKC Performed at [facility]   Report Status 01/06/2014 FINAL  Final   Organism ID, Bacteria KLEBSIELLA OXYTOCA  Final      Susceptibility   Klebsiella oxytoca - MIC*    AMPICILLIN >=32 RESISTANT Resistant     AMPICILLIN/SULBACTAM 16 INTERMEDIATE Intermediate     CEFAZOLIN >=64 RESISTANT Resistant     CEFEPIME <=1 SENSITIVE Sensitive     CEFTAZIDIME <=1 SENSITIVE Sensitive     CEFTRIAXONE <=1 SENSITIVE Sensitive     CIPROFLOXACIN <=0.25 SENSITIVE Sensitive     GENTAMICIN <=1 SENSITIVE Sensitive     IMIPENEM <=0.25 SENSITIVE Sensitive     PIP/TAZO <=4 SENSITIVE Sensitive     TOBRAMYCIN <=1 SENSITIVE Sensitive     TRIMETH/SULFA <=20 SENSITIVE Sensitive      * KLEBSIELLA OXYTOCA  Culture, blood (routine x 2)     Status: None   Collection Time: 01/02/14 11:44 PM  Result Value Ref Range Status   Specimen Description BLOOD RIGHT FOREARM  Final   Special Requests BOTTLES DRAWN AEROBIC AND ANAEROBIC 5CC EACH  Final   Culture  Setup Time   Final    01/03/2014 08:25 Performed at [facility]   Culture   Final    NO GROWTH 5 DAYS Performed at [facility]   Report Status 01/09/2014 FINAL  Final  Gram stain     Status: None   Collection Time: 01/03/14  5:26 AM  Result Value Ref Range Status   Specimen Description URINE, CLEAN CATCH  Final   Special  Requests NONE  Final   Gram Stain   Final    CYTOSPIN SLIDE SQUAMOUS EPITHELIAL CELLS PRESENT WBC PRESENT, PREDOMINANTLY PMN GRAM NEGATIVE RODS   Report Status 01/03/2014 FINAL  Final  Culture, blood (routine x 2)     Status: None   Collection Time: 01/04/14  9:08 AM  Result Value Ref Range Status   Specimen Description BLOOD LEFT FOREARM  Final   Special Requests BOTTLES DRAWN AEROBIC AND ANAEROBIC 10CC  Final   Culture  Setup Time   Final    01/04/2014 14:03 Performed at [facility]   Culture   Final    NO GROWTH 5 DAYS Performed at [facility]   Report Status 01/10/2014 FINAL  Final  Culture, blood (routine x 2)     Status: None   Collection Time: 01/04/14  9:15 AM  Result Value Ref Range Status   Specimen Description BLOOD LEFT HAND  Final   Special Requests BOTTLES DRAWN AEROBIC AND ANAEROBIC 10CC  Final   Culture  Setup Time   Final    01/04/2014 14:03 Performed at [facility]   Culture   Final    NO GROWTH 5 DAYS Performed at [facility]   Report Status 01/10/2014 FINAL  Final   Creatinine:  Recent Labs  08/20/14 2003  CREATININE 1.63*    Imaging: Ct Abdomen Pelvis Wo Contrast  08/20/2014   CLINICAL DATA:  Right flank pain  EXAM: CT ABDOMEN AND PELVIS WITHOUT CONTRAST  TECHNIQUE: Multidetector CT imaging of the  abdomen and pelvis was performed following the standard protocol without IV contrast.  COMPARISON:  None.  FINDINGS: Lower chest: There are multiple small nodules identified in both lungs. Index nodule in the right middle lobe measures 5 mm, image 9 of series 203. Lingular nodule measures 4 mm, image 13/ series 203. No pleural effusion noted  Hepatobiliary: Diffuse fatty infiltration of the liver. The gallbladder is normal. There is no biliary dilatation. Normal appearance of the pancreas.  Pancreas: Normal appearance of the pancreas.  Spleen: The spleen is on unremarkable.  Adrenals/Urinary Tract: The adrenal glands are both normal. 3 mm nonobstructing calculus is identified within the upper pole the left kidney. Low-attenuation structure in the left kidney measures 1.8 cm and is incompletely characterized without IV contrast. There is a 5 mm stone within the lower pole of the right kidney. At the right UPJ there is a 1.2 by 1.1 cm stone. This causes mild right hydronephrosis. The urinary bladder appears normal.  Stomach/Bowel: The stomach is normal. The small bowel loops have a normal course and caliber. No obstruction. Normal appearance of the colon.  Vascular/Lymphatic: Calcified atherosclerotic disease involves the abdominal aorta. There is no aneurysm. There is no retroperitoneal adenopathy identified no pelvic or inguinal adenopathy noted.  Reproductive: Prostate gland and seminal vesicles appear normal.  Other: There is no free fluid or fluid collections identified within the abdomen or pelvis.  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 There are bilateral L5 pars defects identified. Anterolisthesis of L5 on S1 appears normal.  IMPRESSION: 1. Large stone is identified at the right UPJ. This causes right-sided hydronephrosis. Smaller nonobstructing calculus is noted within the inferior pole  collecting system of the right kidney. 2. Advanced lumbar spondylosis. 3. There are multiple indeterminate nodules identified within the lung bases. Consider followup common nonemergent CT of the chest for further investigation.  Electronically Signed   By: Signa Kellaylor  Stroud M.D.   On: 08/20/2014 22:02   Dg Chest 2 View  08/21/2014   CLINICAL DATA:  Right-sided flank pain with fever.  Kidney stone.  EXAM: CHEST  2 VIEW  COMPARISON:  01/02/2014  FINDINGS: Normal heart size. No pleural effusion identified. There is no airspace consolidation. Asymmetric elevation of the right hemidiaphragm is identified. No pleural effusion or edema identified. There is no airspace consolidation. Spondylosis identified within the thoracic spine.  IMPRESSION: 1. No acute findings. 2. Asymmetric elevation of right hemidiaphragm.   Electronically Signed   By: Signa Kellaylor  Stroud M.D.   On: 08/21/2014 00:06    The CT scan images were independently reviewed. Impression/Assessment:  1. He has a right UPJ stone that has caused some discomfort over the past 48 hours. While he may still need the placement of a double-J stent I did not feel that was indicated initially as he had minimal urinary findings to suggest a significant infection, reported never having had a previous UTI and had only a minimal elevation of his white blood cell count which can be seen for this degree with kidney stone pain alone. He has been placed on antibiotic therapy and his white blood cell count has decreased and out into the normal range. His right flank pain has improved and he remains afebrile with stable vital signs. We had a long discussion about the treatment options for his stone and the possible need for stent placement at some point. I told him at this point I was going to hold off on stent placement but if he had any signs or symptoms to suggest an infection that was worsening then a stent would need to be placed. The treatment options for his stone  within discussed including a percutaneous nephrolithotomy, ureteroscopy with laser lithotripsy and ESWL. We discussed the pluses and minuses of each of these treatment options. I told him that once he was out of the hospital we would need to set up a time for treatment and based on the patient's stone size and its location I feel that he would be best managed with lithotripsy.  2. He has bilateral, nonobstructing renal calculi. These are not causing any difficulty currently but will need to be followed over time with serial imaging. Workup of the cause for his stone formation will also need to be undertaken with a 24-hour urine study.  3. He did have a lesion in the left kidney that appeared to be decreased density relative to the surrounding renal parenchyma most consistent with a left renal cyst.  Plan:  1. His urine has been cultured and will be followed up. 2. Continue intravenous antibiotics. 3. His stone is of a size that spontaneous passage is unlikely and therefore he does not need Flomax for medical expulsive therapy. 4. He will be scheduled for definitive treatment of his right UPJ stone as an outpatient.  Hunner Garcon C 08/21/2014, 8:02 AM

## 2014-08-22 ENCOUNTER — Encounter (HOSPITAL_COMMUNITY): Payer: Self-pay

## 2014-08-22 ENCOUNTER — Inpatient Hospital Stay (HOSPITAL_COMMUNITY): Payer: Medicare HMO | Admitting: Anesthesiology

## 2014-08-22 ENCOUNTER — Encounter (HOSPITAL_COMMUNITY)
Admission: EM | Disposition: A | Discharge status: Home Health | Payer: Self-pay | Source: Home / Self Care | Attending: Internal Medicine

## 2014-08-22 ENCOUNTER — Ambulatory Visit: Payer: Self-pay | Admitting: Urology

## 2014-08-22 DIAGNOSIS — IMO0001 Reserved for inherently not codable concepts without codable children: Secondary | ICD-10-CM | POA: Insufficient documentation

## 2014-08-22 DIAGNOSIS — R7881 Bacteremia: Secondary | ICD-10-CM

## 2014-08-22 HISTORY — PX: CYSTOSCOPY/RETROGRADE/URETEROSCOPY: SHX5316

## 2014-08-22 LAB — GLUCOSE, CAPILLARY
GLUCOSE-CAPILLARY: 123 mg/dL — AB (ref 70–99)
GLUCOSE-CAPILLARY: 113 mg/dL — AB (ref 70–99)
GLUCOSE-CAPILLARY: 122 mg/dL — AB (ref 70–99)
GLUCOSE-CAPILLARY: 129 mg/dL — AB (ref 70–99)
Glucose-Capillary: 191 mg/dL — ABNORMAL HIGH (ref 70–99)
Glucose-Capillary: 113 mg/dL — ABNORMAL HIGH (ref 70–99)
Glucose-Capillary: 173 mg/dL — ABNORMAL HIGH (ref 70–99)

## 2014-08-22 LAB — BASIC METABOLIC PANEL
Anion gap: 16 — ABNORMAL HIGH (ref 5–15)
BUN: 44 mg/dL — ABNORMAL HIGH (ref 6–23)
CHLORIDE: 95 meq/L — AB (ref 96–112)
CO2: 20 mEq/L (ref 19–32)
Calcium: 7.7 mg/dL — ABNORMAL LOW (ref 8.4–10.5)
Creatinine, Ser: 2.52 mg/dL — ABNORMAL HIGH (ref 0.50–1.35)
GFR calc Af Amer: 28 mL/min — ABNORMAL LOW (ref >90)
GFR calc non Af Amer: 24 mL/min — ABNORMAL LOW (ref >90)
Glucose, Bld: 113 mg/dL — ABNORMAL HIGH (ref 70–99)
Potassium: 4 mEq/L (ref 3.7–5.3)
Sodium: 131 mEq/L — ABNORMAL LOW (ref 137–147)

## 2014-08-22 LAB — EXPECTORATED SPUTUM ASSESSMENT W REFEX TO RESP CULTURE

## 2014-08-22 LAB — EXPECTORATED SPUTUM ASSESSMENT W GRAM STAIN, RFLX TO RESP C: Special Requests: NORMAL

## 2014-08-22 LAB — CBC
HCT: 38.7 % — ABNORMAL LOW (ref 39.0–52.0)
HEMOGLOBIN: 13.1 g/dL (ref 13.0–17.0)
MCH: 29.9 pg (ref 26.0–34.0)
MCHC: 33.9 g/dL (ref 30.0–36.0)
MCV: 88.4 fL (ref 78.0–100.0)
Platelets: 114 10*3/uL — ABNORMAL LOW (ref 150–400)
RBC: 4.38 MIL/uL (ref 4.22–5.81)
RDW: 13.8 % (ref 11.5–15.5)
WBC: 14 10*3/uL — ABNORMAL HIGH (ref 4.0–10.5)

## 2014-08-22 LAB — MRSA PCR SCREENING: MRSA by PCR: NEGATIVE

## 2014-08-22 LAB — CBG MONITORING, ED: Glucose-Capillary: 200 mg/dL — ABNORMAL HIGH (ref 70–99)

## 2014-08-22 SURGERY — CYSTOSCOPY/RETROGRADE/URETEROSCOPY
Anesthesia: General | Laterality: Right

## 2014-08-22 MED ORDER — SODIUM CHLORIDE 0.9 % IR SOLN
Status: DC | PRN
  Administered 2014-08-22: 3000 mL via INTRAVESICAL

## 2014-08-22 MED ORDER — PROPOFOL 10 MG/ML IV BOLUS
INTRAVENOUS | Status: DC | PRN
  Administered 2014-08-22: 200 mg via INTRAVENOUS

## 2014-08-22 MED ORDER — PROPOFOL 10 MG/ML IV BOLUS
INTRAVENOUS | Status: AC
  Filled 2014-08-22: qty 20

## 2014-08-22 MED ORDER — IOHEXOL 300 MG/ML  SOLN
INTRAMUSCULAR | Status: DC | PRN
  Administered 2014-08-22: 10 mL

## 2014-08-22 MED ORDER — FENTANYL CITRATE 0.05 MG/ML IJ SOLN
INTRAMUSCULAR | Status: DC | PRN
  Administered 2014-08-22: 50 ug via INTRAVENOUS

## 2014-08-22 MED ORDER — DEXTROSE 5 % IV SOLN
2.0000 g | Freq: Every day | INTRAVENOUS | Status: DC
  Administered 2014-08-22: 2 g via INTRAVENOUS
  Filled 2014-08-22: qty 2

## 2014-08-22 MED ORDER — LIP MEDEX EX OINT
TOPICAL_OINTMENT | CUTANEOUS | Status: AC
  Administered 2014-08-22: 12:00:00
  Filled 2014-08-22: qty 7

## 2014-08-22 MED ORDER — SODIUM CHLORIDE 0.9 % IV SOLN
250.0000 mg | Freq: Four times a day (QID) | INTRAVENOUS | Status: DC
  Administered 2014-08-22 – 2014-08-24 (×9): 250 mg via INTRAVENOUS
  Filled 2014-08-22 (×10): qty 250

## 2014-08-22 MED ORDER — SODIUM CHLORIDE 0.9 % IV SOLN
INTRAVENOUS | Status: DC
  Administered 2014-08-22 – 2014-08-23 (×2): via INTRAVENOUS

## 2014-08-22 MED ORDER — FENTANYL CITRATE 0.05 MG/ML IJ SOLN
INTRAMUSCULAR | Status: AC
  Filled 2014-08-22: qty 2

## 2014-08-22 MED ORDER — FENTANYL CITRATE 0.05 MG/ML IJ SOLN: 25.0000 ug | INTRAMUSCULAR | Status: DC | PRN

## 2014-08-22 MED ORDER — MIDAZOLAM HCL 5 MG/5ML IJ SOLN
INTRAMUSCULAR | Status: DC | PRN
Start: 2014-08-22 — End: 2014-08-22
  Administered 2014-08-22: 2 mg via INTRAVENOUS

## 2014-08-22 MED ORDER — MIDAZOLAM HCL 2 MG/2ML IJ SOLN
INTRAMUSCULAR | Status: AC
  Filled 2014-08-22: qty 2

## 2014-08-22 MED ORDER — ONDANSETRON HCL 4 MG/2ML IJ SOLN
INTRAMUSCULAR | Status: DC | PRN
  Administered 2014-08-22: 4 mg via INTRAVENOUS

## 2014-08-22 MED ORDER — SODIUM CHLORIDE 0.9 % IV SOLN
INTRAVENOUS | Status: DC
  Administered 2014-08-22: 75 mL/h via INTRAVENOUS

## 2014-08-22 SURGICAL SUPPLY — 14 items
BAG URO CATCHER STRL LF (DRAPE) ×2 IMPLANT
BASKET ZERO TIP NITINOL 2.4FR (BASKET) IMPLANT
BSKT STON RTRVL ZERO TP 2.4FR (BASKET)
CATH INTERMIT  6FR 70CM (CATHETERS) ×2 IMPLANT
DRAPE CAMERA CLOSED 9X96 (DRAPES) ×2 IMPLANT
GLOVE BIOGEL M 8.0 STRL (GLOVE) ×2 IMPLANT
GOWN STRL REUS W/ TWL XL LVL3 (GOWN DISPOSABLE) ×1 IMPLANT
GOWN STRL REUS W/TWL XL LVL3 (GOWN DISPOSABLE) ×4 IMPLANT
GUIDEWIRE ANG ZIPWIRE 038X150 (WIRE) IMPLANT
GUIDEWIRE STR DUAL SENSOR (WIRE) ×1 IMPLANT
MANIFOLD NEPTUNE II (INSTRUMENTS) ×2 IMPLANT
PACK CYSTO (CUSTOM PROCEDURE TRAY) ×2 IMPLANT
STENT CONTOUR 6FRX26X.038 (STENTS) ×1 IMPLANT
TUBING CONNECTING 10 (TUBING) ×2 IMPLANT

## 2014-08-22 NOTE — Anesthesia Preprocedure Evaluation (Signed)
Anesthesia Evaluation  Patient identified by MRN, date of birth, ID band Patient awake    Reviewed: Allergy & Precautions, H&P , NPO status , Patient's Chart, lab work & pertinent test results  History of Anesthesia Complications Negative for: history of anesthetic complications  Airway Mallampati: II  TM Distance: >3 FB Neck ROM: Full   Comment: Beefy red toungue Dental  (+)    Pulmonary neg pulmonary ROS,  breath sounds clear to auscultation        Cardiovascular hypertension, Pt. on medications and Pt. on home beta blockers Rhythm:Regular     Neuro/Psych negative neurological ROS  negative psych ROS   GI/Hepatic negative GI ROS, Neg liver ROS,   Endo/Other  diabetes, Type 2, Oral Hypoglycemic AgentsMorbid obesity  Renal/GU Renal InsufficiencyRenal disease     Musculoskeletal   Abdominal   Peds  Hematology negative hematology ROS (+)   Anesthesia Other Findings   Reproductive/Obstetrics                             Anesthesia Physical Anesthesia Plan  ASA: III  Anesthesia Plan: General   Post-op Pain Management:    Induction: Intravenous  Airway Management Planned: LMA  Additional Equipment: None  Intra-op Plan:   Post-operative Plan: Extubation in OR  Informed Consent: I have reviewed the patients History and Physical, chart, labs and discussed the procedure including the risks, benefits and alternatives for the proposed anesthesia with the patient or authorized representative who has indicated his/her understanding and acceptance.   Dental advisory given  Plan Discussed with: CRNA and Surgeon  Anesthesia Plan Comments:         Anesthesia Quick Evaluation

## 2014-08-22 NOTE — Op Note (Signed)
PATIENT:  Cody [name]  Preoperative diagnosis:  1. Obstructing right ureteral stone 2. Fever and positive blood culture  Postoperative diagnosis:  1. Same  Procedure:  1. Cystoscopy 2. Right ureteral stent placement 6 French, 26 cm (no string) 3. Right retrograde pyelography with interpretation  Surgeon: Loraine LericheMark C. Vernie Ammonsttelin, M.D.  Anesthesia: General  Complications: None  EBL: None  Specimens: None  Indication: Cody [name] is a patient with an obstructing right UPJ stone. He initially had fever but minimal elevation of his white count and minimal urine findings.  He improved clinically and remained afebrile over a 24-hour period.  He then spiked a fever to 102 and his blood cultures have returned showing gram-negative rods.  I therefore have recommended right double-J stent placement. After reviewing the management options for treatment, they have elected to proceed with the above surgical procedure(s). We have discussed the potential benefits and risks of the procedure, side effects of the proposed treatment, the likelihood of the patient achieving the goals of the procedure, and any potential problems that might occur during the procedure or recuperation. Informed consent has been obtained.  Description of procedure:  The patient was taken to the operating room and general anesthesia was induced.  The patient was placed in the dorsal lithotomy position, prepped and draped in the usual sterile fashion, and preoperative antibiotics were administered. A preoperative time-out was performed.   Cystourethroscopy was performed.  The patient's urethra was examined and was normal. His prostate revealed some slight elongation and lateral lobe hypertrophy but was not particularly obstructing. The bladder was then systematically examined in its entirety. There was no evidence for any bladder tumors, stones, or other mucosal pathology.    Attention then turned to the right ureteral orifice and a  6 French ureteral catheter was used to intubate the ureteral orifice.  Omnipaque contrast was injected through the ureteral catheter and a retrograde pyelogram was performed.  This revealed a normal-appearing ureter up to the level of the UPJ where a filling defect consistent with his stone was identified.  Contrast could be seen progressing past the stone into the renal pelvis although I use minimal pressure and therefore did not fill out the entire collecting system with contrast.  A 0.038 sensor guidewire was then advanced up the right ureter into the renal pelvis under fluoroscopic guidance through the ureteral catheter. I then passed the stent over the guidewire.  The stent was positioned appropriately under fluoroscopic and cystoscopic guidance.  The wire was then removed with an adequate stent curl noted in the renal pelvis as well as in the bladder. Observation revealed grossly purulent material exiting from the stent within the bladder.  The bladder was then emptied and the procedure ended.  The patient appeared to tolerate the procedure well and without complications.  The patient was able to be awakened and transferred to the recovery unit in satisfactory condition.

## 2014-08-22 NOTE — Anesthesia Postprocedure Evaluation (Signed)
  Anesthesia Post-op Note  Patient: Cody [name]  Procedure(s) Performed: Procedure(s): Cystoscopy, Right Retrograde Pyelogram, Right Ureteral Stent Placement (Right)  Patient Location: PACU  Anesthesia Type:General  Level of Consciousness: awake and alert   Airway and Oxygen Therapy: Patient Spontanous Breathing and Patient connected to nasal cannula oxygen  Post-op Pain: none  Post-op Assessment: Post-op Vital signs reviewed, Patient's Cardiovascular Status Stable, Respiratory Function Stable, Patent Airway, No signs of Nausea or vomiting and Pain level controlled  Post-op Vital Signs: Reviewed and stable  Last Vitals:  Filed Vitals:   08/22/14 1540  BP:   Pulse: 105  Temp:   Resp: 20    Complications: No apparent anesthesia complications

## 2014-08-22 NOTE — Transfer of Care (Signed)
Immediate Anesthesia Transfer of Care Note  Patient: Cody [name]  Procedure(s) Performed: Procedure(s): Cystoscopy, Right Retrograde Pyelogram, Right Ureteral Stent Placement (Right)  Patient Location: PACU  Anesthesia Type:General  Level of Consciousness: awake, alert , oriented and patient cooperative  Airway & Oxygen Therapy: Patient Spontanous Breathing and Patient connected to face mask oxygen  Post-op Assessment: Report given to PACU RN, Post -op Vital signs reviewed and stable and Patient moving all extremities  Post vital signs: Reviewed and stable  Complications: No apparent anesthesia complications

## 2014-08-22 NOTE — Progress Notes (Signed)
ANTIBIOTIC CONSULT NOTE - INITIAL  Pharmacy Consult for Cefepime Indication: bacteremia GNR, UTI  Allergies  Allergen Reactions  . Amoxapine And Related Swelling  . Amoxicillin Swelling    Patient Measurements: Height: 5\' 11"  (180.3 cm) Weight: 239 lb 6.7 oz (108.6 kg) IBW/kg (Calculated) : 75.3 Adjusted Body Weight:   Vital Signs: Temp: 100.7 F (38.2 C) (11/02 0159) Temp Source: Oral (11/02 0159) BP: 113/67 mmHg (11/01 2321) Pulse Rate: 107 (11/01 2321) Intake/Output from previous day: 11/01 0701 - 11/02 0700 In: 842 [P.O.:842] Out: 750 [Urine:750] Intake/Output from this shift: Total I/O In: -  Out: 400 [Urine:400]  Labs:  Recent Labs  08/20/14 2003 08/21/14 0723  WBC 12.9* 9.2  HGB 14.3 13.6  PLT 184 120*  CREATININE 1.63* 2.57*  2.59*   Estimated Creatinine Clearance: 33.7 mL/min (by C-G formula based on Cr of 2.59). No results for input(s): VANCOTROUGH, VANCOPEAK, VANCORANDOM, GENTTROUGH, GENTPEAK, GENTRANDOM, TOBRATROUGH, TOBRAPEAK, TOBRARND, AMIKACINPEAK, AMIKACINTROU, AMIKACIN in the last 72 hours.   Microbiology: Recent Results (from the past 720 hour(s))  Culture, blood (routine x 2)     Status: None (Preliminary result)   Collection Time: 08/20/14 10:25 PM  Result Value Ref Range Status   Specimen Description BLOOD LEFT ARM  Final   Special Requests BOTTLES DRAWN AEROBIC AND ANAEROBIC 5CC EA  Final   Culture  Setup Time   Final    08/21/2014 04:11 Performed at [facility]    Culture   Final    GRAM NEGATIVE RODS Note: CRITICAL RESULT CALLED TO, READ BACK BY AND VERIFIED WITH: Orlean BradfordVERA JACKSON RN @ 1040PM Payton DoughtyVINCJ [id number] Performed at [facility]    Report Status PENDING  Incomplete    Medical History: Past Medical History  Diagnosis Date  . Back pain     lumbar  . Urgency of urination   . Hammer toe   . Diabetes mellitus   . Unspecified essential hypertension   . Parkinson disease     Medications:  Anti-infectives     Start     Dose/Rate Route Frequency Ordered Stop   08/22/14 0345  ceFEPIme (MAXIPIME) 2 g in dextrose 5 % 50 mL IVPB     2 g100 mL/hr over 30 Minutes Intravenous Daily at bedtime 08/22/14 0332     08/21/14 2200  cefTRIAXone (ROCEPHIN) 1 g in dextrose 5 % 50 mL IVPB  Status:  Discontinued     1 g100 mL/hr over 30 Minutes Intravenous Daily at bedtime 08/21/14 0014 08/22/14 0327   08/20/14 2230  cefTRIAXone (ROCEPHIN) 1 g in dextrose 5 % 50 mL IVPB  Status:  Discontinued     1 g100 mL/hr over 30 Minutes Intravenous  Once 08/20/14 2223 08/20/14 2335     Assessment: Patient with bacteremia GNR and UTI.  Ordered zosyn but changed due to amoxicillin allergy.  Patient with poor renal function.  Goal of Therapy: Cefepime dosed based on patient weight and renal function   Plan:  Follow up culture results  Cefepime 2gm iv q24hr  Aleene DavidsonGrimsley Jr, Josearmando Kuhnert Crowford 08/22/2014,3:35 AM

## 2014-08-22 NOTE — Progress Notes (Signed)
Subjective: Patient reports no new complaints. Specifically is not having any significant flank pain.  Objective: Vital signs in last 24 hours: Temp:  [98.1 F (36.7 C)-102.7 F (39.3 C)] 99.3 F (37.4 C) (11/02 0641) Pulse Rate:  [101-113] 101 (11/02 0641) Resp:  [16-20] 16 (11/02 0641) BP: (105-136)/(62-102) 105/66 mmHg (11/02 0641) SpO2:  [96 %-97 %] 96 % (11/02 0641)  Intake/Output from previous day: 11/01 0701 - 11/02 0700 In: 842 [P.O.:842] Out: 750 [Urine:750] Intake/Output this shift:    Physical Exam:  Mild right CVAT. Abdomen is soft and benign.  Lab Results:  Recent Labs  08/20/14 2003 08/21/14 0723 08/22/14 0506  HGB 14.3 13.6 13.1  HCT 42.5 40.9 38.7*   BMET  Recent Labs  08/21/14 0723 08/22/14 0506  NA 136*  140 131*  K 3.9  4.0 4.0  CL 99  102 95*  CO2 18*  19 20  GLUCOSE 167*  166* 113*  BUN 35*  35* 44*  CREATININE 2.57*  2.59* 2.52*  CALCIUM 8.3*  8.1* 7.7*    Recent Labs  08/21/14 1130  INR 1.39   No results for input(s): LABURIN in the last 72 hours. Results for orders placed or performed during the hospital encounter of 08/20/14  Culture, blood (routine x 2)     Status: None (Preliminary result)   Collection Time: 08/20/14 10:25 PM  Result Value Ref Range Status   Specimen Description BLOOD LEFT ARM  Final   Special Requests BOTTLES DRAWN AEROBIC AND ANAEROBIC 5CC EA  Final   Culture  Setup Time   Final    08/21/2014 04:11 Performed at [facility]    Culture   Final    GRAM NEGATIVE RODS Note: CRITICAL RESULT CALLED TO, READ BACK BY AND VERIFIED WITH: Orlean BradfordVERA JACKSON RN @ 1040PM Payton DoughtyVINCJ [id number] Performed at [facility]    Report Status PENDING  Incomplete    Studies/Results: Ct Abdomen Pelvis Wo Contrast  08/20/2014   CLINICAL DATA:  Right flank pain  EXAM: CT ABDOMEN AND PELVIS WITHOUT CONTRAST  TECHNIQUE: Multidetector CT imaging of the abdomen and pelvis was performed following the standard  protocol without IV contrast.  COMPARISON:  None.  FINDINGS: Lower chest: There are multiple small nodules identified in both lungs. Index nodule in the right middle lobe measures 5 mm, image 9 of series 203. Lingular nodule measures 4 mm, image 13/ series 203. No pleural effusion noted  Hepatobiliary: Diffuse fatty infiltration of the liver. The gallbladder is normal. There is no biliary dilatation. Normal appearance of the pancreas.  Pancreas: Normal appearance of the pancreas.  Spleen: The spleen is on unremarkable.  Adrenals/Urinary Tract: The adrenal glands are both normal. 3 mm nonobstructing calculus is identified within the upper pole the left kidney. Low-attenuation structure in the left kidney measures 1.8 cm and is incompletely characterized without IV contrast. There is a 5 mm stone within the lower pole of the right kidney. At the right UPJ there is a 1.2 by 1.1 cm stone. This causes mild right hydronephrosis. The urinary bladder appears normal.  Stomach/Bowel: The stomach is normal. The small bowel loops have a normal course and caliber. No obstruction. Normal appearance of the colon.  Vascular/Lymphatic: Calcified atherosclerotic disease involves the abdominal aorta. There is no aneurysm. There is no retroperitoneal adenopathy identified no pelvic or inguinal adenopathy noted.  Reproductive: Prostate gland and seminal vesicles appear normal.  Other: There is no free fluid or fluid collections identified within the abdomen  or pelvis.  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 There are bilateral L5 pars defects identified. Anterolisthesis of L5 on S1 appears normal.  IMPRESSION: 1. Large stone is identified at the right UPJ. This causes right-sided hydronephrosis. Smaller nonobstructing calculus is noted within the inferior pole collecting system of the right kidney. 2. Advanced lumbar  spondylosis. 3. There are multiple indeterminate nodules identified within the lung bases. Consider followup common nonemergent CT of the chest for further investigation.   Electronically Signed   By: Signa Kellaylor  Stroud M.D.   On: 08/20/2014 22:02   Dg Chest 2 View  08/21/2014   CLINICAL DATA:  Right-sided flank pain with fever.  Kidney stone.  EXAM: CHEST  2 VIEW  COMPARISON:  01/02/2014  FINDINGS: Normal heart size. No pleural effusion identified. There is no airspace consolidation. Asymmetric elevation of the right hemidiaphragm is identified. No pleural effusion or edema identified. There is no airspace consolidation. Spondylosis identified within the thoracic spine.  IMPRESSION: 1. No acute findings. 2. Asymmetric elevation of right hemidiaphragm.   Electronically Signed   By: Signa Kellaylor  Stroud M.D.   On: 08/21/2014 00:06    Assessment/Plan: Although clinically he was doing well over the past 24 hours he has had a fever again to 102. In addition a blood culture has shown gram negative rods with ID and sensitivity pending. Because of these new findings I have recommended proceeding with cystoscopy and right double-J stent placement. I have gone over this with the patient in detail. We discussed the procedure, its risks and complications, the probability of success and the anticipated postoperative course. He understands and is elected to proceed.   His creatinine is elevated although he has what appears to be a normal left kidney and complete obstruction of one renal unit should not result in an elevation of his creatinine of this degree. It would appear there is some underlying medical renal disease and whether this is chronic or acute will remain to be seen. Clearly a stent will optimize his renal function.  Nothing by mouth  Placement of right double-J stent today.   LOS: 2 days   Lynx Goodrich C 08/22/2014, 7:08 AM

## 2014-08-22 NOTE — Discharge Instructions (Signed)

## 2014-08-22 NOTE — Progress Notes (Signed)
RN paged this PA with blood cx results of GNR.   Started on Cefepime, as pt is allergic to PCN.  Also, pt is on  Rocephin for UTI, and as per pharmacy, d/c'd Rocephin, as Cefepime will also cover UTI.                [name]   [facility]

## 2014-08-22 NOTE — Progress Notes (Signed)
PROGRESS NOTE  Cody GrateCarlas [name] ZOX:[id number] DOB: [birth year] DOA: 08/20/2014 PCP: Hollice EspyGATES,DONNA RUTH, MD  Brief history 68 year old male with a history of diabetes mellitus, hypertension, Parkinson's disease presents with one-day history of right flank pain that developed in the early morning hours of 08/19/2014. The patient had some subjective fever and chills with progressive right flank pain with associated nausea and vomiting. As a result, he presented to the emergency department where the patient was found have temperature 102.52F. CT of the abdomen and pelvis revealed a 1.2 cm right UPJ stone. The patient had WBC 12.9 with serum creatinine of 1.63 at the time of presentation. The patient was started on ceftriaxone after urine and blood cultures were obtained. Urinalysis shows 11-20 WBC.  When the patient's preliminary blood cultures grew gram-negative rods, the patient was empirically  Switched from ceftriaxone to imipenem pending final susceptibilities.  Dr. Vernie Ammonsttelin continued to follow the patient, and he took the patient to OR on 08/22/14. Assessment/Plan: Sepsis -present at the time of admission -Secondary to bacteremia -Source of bacteremia is likely genitourinary -Broaden IV antibiotics pending blood culture data -Discontinue ceftriaxone -Start imipenem as the patient has swelling with penicillin -Surveillance blood cultures 08/23/2014 AKI -Baseline creatinine 0.9-1.1 -Likely multifactorial including volume depletion in the setting of ACEi/HCTZ, NSAIDs/toradol (in ED), sepsis, and obstructive uropathy -patient also took 9 tablets of ibuprofen on the day he came to hospital -Discontinue NSAIDs, ACEi, HCTZ -Judicious hydration -Avoid nephrotoxic agents -renal function--appears to be at plateau today Nephrolithiasis/obstructive uropathy -Appreciate Dr. Leontine Locketttelin-->plans noted for cystoscopy and double J stent -continue abx pending culture data -08/20/2014 CT abdomen and  pelvis--R-UPJ stone -Vomiting has improved Diabetes mellitus type 2 -Discontinue glipizide -NovoLog sliding scale -Hemoglobin A1c -d/c metformin/glipizide Hypertension -Hold atenolol, lisinopril, HCTZ as blood pressure is soft Parkinson's disease -continue Stalevo Depression -Continue Zoloft Pulmonary nodules -Incidental finding on CT of the abdomen and pelvis -Will need outpatient surveillance Thrombocytopenia -Patient appears to have mild thrombocytopenia on previous labs -Check INR, PTT, fibrinogen--not suggestive of DIC  Family Communication: significant other updated at bedside Disposition Plan: Home when medically stable  Antibiotics:  Ceftriaxone 08/20/2014>>>08/22/14  Imipenem 08/22/14>>>         Procedures/Studies: Ct Abdomen Pelvis Wo Contrast  08/20/2014   CLINICAL DATA:  Right flank pain  EXAM: CT ABDOMEN AND PELVIS WITHOUT CONTRAST  TECHNIQUE: Multidetector CT imaging of the abdomen and pelvis was performed following the standard protocol without IV contrast.  COMPARISON:  None.  FINDINGS: Lower chest: There are multiple small nodules identified in both lungs. Index nodule in the right middle lobe measures 5 mm, image 9 of series 203. Lingular nodule measures 4 mm, image 13/ series 203. No pleural effusion noted  Hepatobiliary: Diffuse fatty infiltration of the liver. The gallbladder is normal. There is no biliary dilatation. Normal appearance of the pancreas.  Pancreas: Normal appearance of the pancreas.  Spleen: The spleen is on unremarkable.  Adrenals/Urinary Tract: The adrenal glands are both normal. 3 mm nonobstructing calculus is identified within the upper pole the left kidney. Low-attenuation structure in the left kidney measures 1.8 cm and is incompletely characterized without IV contrast. There is a 5 mm stone within the lower pole of the right kidney. At the right UPJ there is a 1.2 by 1.1 cm stone. This causes mild right hydronephrosis. The urinary  bladder appears normal.  Stomach/Bowel: The stomach is normal. The small bowel loops have a normal course and caliber. No obstruction.  Normal appearance of the colon.  Vascular/Lymphatic: Calcified atherosclerotic disease involves the abdominal aorta. There is no aneurysm. There is no retroperitoneal adenopathy identified no pelvic or inguinal adenopathy noted.  Reproductive: Prostate gland and seminal vesicles appear normal.  Other: There is no free fluid or fluid collections identified within the abdomen or pelvis.  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 There are bilateral L5 pars defects identified. Anterolisthesis of L5 on S1 appears normal.  IMPRESSION: 1. Large stone is identified at the right UPJ. This causes right-sided hydronephrosis. Smaller nonobstructing calculus is noted within the inferior pole collecting system of the right kidney. 2. Advanced lumbar spondylosis. 3. There are multiple indeterminate nodules identified within the lung bases. Consider followup common nonemergent CT of the chest for further investigation.   Electronically Signed   By: Signa Kellaylor  Stroud M.D.   On: 08/20/2014 22:02   Dg Chest 2 View  08/21/2014   CLINICAL DATA:  Right-sided flank pain with fever.  Kidney stone.  EXAM: CHEST  2 VIEW  COMPARISON:  01/02/2014  FINDINGS: Normal heart size. No pleural effusion identified. There is no airspace consolidation. Asymmetric elevation of the right hemidiaphragm is identified. No pleural effusion or edema identified. There is no airspace consolidation. Spondylosis identified within the thoracic spine.  IMPRESSION: 1. No acute findings. 2. Asymmetric elevation of right hemidiaphragm.   Electronically Signed   By: Signa Kellaylor  Stroud M.D.   On: 08/21/2014 00:06         Subjective: Patient complains of right flank pain not much improved from yesterday. Denies any  chest pain, shortness breath, nausea, vomiting, diarrhea. He is passing gas. Denies any headache but complains of fevers and chills.  Objective: Filed Vitals:   08/21/14 1829 08/21/14 2321 08/22/14 0159 08/22/14 0641  BP: 116/62 113/67  105/66  Pulse: 104 107  101  Temp:  102.7 F (39.3 C) 100.7 F (38.2 C) 99.3 F (37.4 C)  TempSrc:  Oral Oral Oral  Resp: 20 20  16   Height:      Weight:      SpO2: 97% 96%  96%    Intake/Output Summary (Last 24 hours) at 08/22/14 1249 Last data filed at 08/22/14 0200  Gross per 24 hour  Intake    842 ml  Output    750 ml  Net     92 ml   Weight change:  Exam:   General:  Pt is alert, follows commands appropriately, not in acute distress  HEENT: No icterus, No thrush,  [place]/AT  Cardiovascular: RRR, S1/S2, no rubs, no gallops  Respiratory: fine bibasilar crackles. No wheezing. Good air movement.  Abdomen: Soft/+BS, non tender, non distended, no guarding; right flank tenderness to palpation  Extremities: No edema, No lymphangitis, No petechiae, No rashes, no synovitis  Data Reviewed: Basic Metabolic Panel:  Recent Labs Lab 08/20/14 2003 08/21/14 0723 08/22/14 0506  NA 134* 136*  140 131*  K 4.0 3.9  4.0 4.0  CL 94* 99  102 95*  CO2 25 18*  19 20  GLUCOSE 202* 167*  166* 113*  BUN 26* 35*  35* 44*  CREATININE 1.63* 2.57*  2.59* 2.52*  CALCIUM 9.1 8.3*  8.1* 7.7*   Liver Function Tests:  Recent Labs Lab 08/20/14 2003 08/21/14 0723  AST 27 27  ALT 6 20  ALKPHOS 68 65  BILITOT 1.1 1.0  PROT 7.5 6.0  ALBUMIN 4.1  3.3*    Recent Labs Lab 08/20/14 2003  LIPASE 22   No results for input(s): AMMONIA in the last 168 hours. CBC:  Recent Labs Lab 08/20/14 2003 08/21/14 0723 08/22/14 0506  WBC 12.9* 9.2 14.0*  NEUTROABS 11.1*  --   --   HGB 14.3 13.6 13.1  HCT 42.5 40.9 38.7*  MCV 88.2 89.9 88.4  PLT 184 120* 114*   Cardiac Enzymes: No results for input(s): CKTOTAL, CKMB, CKMBINDEX, TROPONINI in the  last 168 hours. BNP: Invalid input(s): POCBNP CBG:  Recent Labs Lab 08/21/14 1153 08/21/14 1631 08/21/14 2159 08/22/14 0724 08/22/14 1214  GLUCAP 177* 199* 161* 113* 123*    Recent Results (from the past 240 hour(s))  Urine culture     Status: None (Preliminary result)   Collection Time: 08/20/14  8:09 PM  Result Value Ref Range Status   Specimen Description URINE, RANDOM  Final   Special Requests ADDED 2110  Final   Culture  Setup Time   Final    08/21/2014 14:28 Performed at [facility]    Colony Count   Final    >=100,000 COLONIES/ML Performed at [facility]    Culture   Final    GRAM NEGATIVE RODS Performed at [facility]    Report Status PENDING  Incomplete  Culture, blood (routine x 2)     Status: None (Preliminary result)   Collection Time: 08/20/14 10:25 PM  Result Value Ref Range Status   Specimen Description BLOOD LEFT ARM  Final   Special Requests BOTTLES DRAWN AEROBIC AND ANAEROBIC 5CC EA  Final   Culture  Setup Time   Final    08/21/2014 04:11 Performed at [facility]    Culture   Final    GRAM NEGATIVE RODS Note: CRITICAL RESULT CALLED TO, READ BACK BY AND VERIFIED WITH: Orlean Bradford RN @ 1040PM Payton Doughty [id number] Performed at [facility]    Report Status PENDING  Incomplete  Culture, blood (routine x 2)     Status: None (Preliminary result)   Collection Time: 08/20/14 10:31 PM  Result Value Ref Range Status   Specimen Description BLOOD LEFT FOREARM  Final   Special Requests BOTTLES DRAWN AEROBIC AND ANAEROBIC 5CC EACH  Final   Culture  Setup Time   Final    08/21/2014 04:11 Performed at [facility]    Culture   Final    GRAM VARIABLE ROD Note: Gram Stain Report Called to,Read Back By and Verified With: VERA JACKSON @ 3:53AM 11.2.15 BY DESIS Performed at [facility]    Report Status PENDING  Incomplete  MRSA PCR Screening     Status: None   Collection Time: 08/22/14 10:14 AM    Result Value Ref Range Status   MRSA by PCR NEGATIVE NEGATIVE Final    Comment:        The GeneXpert MRSA Assay (FDA approved for NASAL specimens only), is one component of a comprehensive MRSA colonization surveillance program. It is not intended to diagnose MRSA infection nor to guide or monitor treatment for MRSA infections.      Scheduled Meds: . carbidopa-levodopa  2 tablet Oral 4 times per day   And  . entacapone  200 mg Oral 4 times per day  . cholecalciferol  3,000 Units Oral Daily  . imipenem-cilastatin  250 mg Intravenous Q6H  . insulin aspart  0-5 Units Subcutaneous QHS  . insulin aspart  0-9 Units Subcutaneous TID WC  .  sertraline  100 mg Oral Daily  . sodium chloride  3 mL Intravenous Q12H   Continuous Infusions: . sodium chloride 75 mL/hr (08/22/14 0847)     Chancellor Vanderloop, DO  [facility] Pager [phone]  If 7PM-7AM, please contact night-coverage [email or web address] Password [email or web address] 08/22/2014, 12:49 PM   LOS: 2 days

## 2014-08-23 ENCOUNTER — Inpatient Hospital Stay (HOSPITAL_COMMUNITY): Payer: Medicare HMO

## 2014-08-23 ENCOUNTER — Encounter (HOSPITAL_COMMUNITY): Payer: Self-pay | Admitting: Urology

## 2014-08-23 DIAGNOSIS — I10 Essential (primary) hypertension: Secondary | ICD-10-CM

## 2014-08-23 DIAGNOSIS — N170 Acute kidney failure with tubular necrosis: Secondary | ICD-10-CM

## 2014-08-23 LAB — BASIC METABOLIC PANEL
Anion gap: 17 — ABNORMAL HIGH (ref 5–15)
BUN: 38 mg/dL — ABNORMAL HIGH (ref 6–23)
CHLORIDE: 95 meq/L — AB (ref 96–112)
CO2: 18 meq/L — AB (ref 19–32)
CREATININE: 1.71 mg/dL — AB (ref 0.50–1.35)
Calcium: 8 mg/dL — ABNORMAL LOW (ref 8.4–10.5)
GFR calc Af Amer: 45 mL/min — ABNORMAL LOW (ref >90)
GFR calc non Af Amer: 39 mL/min — ABNORMAL LOW (ref >90)
Glucose, Bld: 160 mg/dL — ABNORMAL HIGH (ref 70–99)
Potassium: 4.3 mEq/L (ref 3.7–5.3)
Sodium: 130 mEq/L — ABNORMAL LOW (ref 137–147)

## 2014-08-23 LAB — URINE CULTURE
COLONY COUNT: NO GROWTH
CULTURE: NO GROWTH

## 2014-08-23 LAB — CBC
HCT: 38.7 % — ABNORMAL LOW (ref 39.0–52.0)
HEMOGLOBIN: 13.2 g/dL (ref 13.0–17.0)
MCH: 29.9 pg (ref 26.0–34.0)
MCHC: 34.1 g/dL (ref 30.0–36.0)
MCV: 87.8 fL (ref 78.0–100.0)
Platelets: 92 10*3/uL — ABNORMAL LOW (ref 150–400)
RBC: 4.41 MIL/uL (ref 4.22–5.81)
RDW: 13.7 % (ref 11.5–15.5)
WBC: 13.3 10*3/uL — ABNORMAL HIGH (ref 4.0–10.5)

## 2014-08-23 LAB — GLUCOSE, CAPILLARY
GLUCOSE-CAPILLARY: 137 mg/dL — AB (ref 70–99)
Glucose-Capillary: 164 mg/dL — ABNORMAL HIGH (ref 70–99)
Glucose-Capillary: 143 mg/dL — ABNORMAL HIGH (ref 70–99)

## 2014-08-23 LAB — HEMOGLOBIN A1C
HEMOGLOBIN A1C: 7.5 % — AB (ref <5.7)
Mean Plasma Glucose: 169 mg/dL — ABNORMAL HIGH (ref <117)

## 2014-08-23 MED ORDER — PHENAZOPYRIDINE HCL 100 MG PO TABS
100.0000 mg | ORAL_TABLET | Freq: Three times a day (TID) | ORAL | Status: DC | PRN
  Administered 2014-08-23: 100 mg via ORAL
  Filled 2014-08-23 (×2): qty 1

## 2014-08-23 MED ORDER — SENNOSIDES-DOCUSATE SODIUM 8.6-50 MG PO TABS: 2.0000 | ORAL_TABLET | Freq: Every evening | ORAL | Status: DC | PRN

## 2014-08-23 MED ORDER — ZOLPIDEM TARTRATE 5 MG PO TABS
5.0000 mg | ORAL_TABLET | Freq: Once | ORAL | Status: AC
  Administered 2014-08-23: 5 mg via ORAL
  Filled 2014-08-23: qty 1

## 2014-08-23 MED ORDER — MIRABEGRON ER 25 MG PO TB24
25.0000 mg | ORAL_TABLET | Freq: Every day | ORAL | Status: DC
  Administered 2014-08-23 – 2014-08-25 (×3): 25 mg via ORAL
  Filled 2014-08-23 (×4): qty 1

## 2014-08-23 MED ORDER — SENNOSIDES-DOCUSATE SODIUM 8.6-50 MG PO TABS
2.0000 | ORAL_TABLET | Freq: Every day | ORAL | Status: DC | PRN
  Administered 2014-08-23 (×2): 2 via ORAL
  Filled 2014-08-23 (×2): qty 2

## 2014-08-23 MED ORDER — FUROSEMIDE 10 MG/ML IJ SOLN
40.0000 mg | Freq: Once | INTRAMUSCULAR | Status: AC
  Administered 2014-08-23: 40 mg via INTRAVENOUS
  Filled 2014-08-23: qty 4

## 2014-08-23 NOTE — Care Management Note (Addendum)
    Page 1 of 2   08/25/2014     12:36:55 PM CARE MANAGEMENT NOTE 08/25/2014  Patient:  Cody [name], Cody [name]   Account Number:  [id number]  Date Initiated:  08/23/2014  Documentation initiated by:  [facility]  Subjective/Objective Assessment:   adm: Flank pain/    1.  Cystoscopy   2.  Right ureteral stent placement 6 French, 26 cm (no string)   3.   Right retrograde pyelography with interpretation     Action/Plan:   discharge planning   Anticipated DC Date:  08/25/2014   Anticipated DC Plan:  [place]  CM consult      [facility] Choice  HOME HEALTH   Choice offered to / List presented to:  C-1 Patient   DME arranged  NA      DME agency  NA     [place] arranged  HH-1 RN  [place].   Status of service:  Completed, signed off Medicare Important Message given?  YES (If response is "NO", the following Medicare IM given date fields will be blank) Date Medicare IM given:  08/25/2014 Medicare IM given by:  [facility] Date Additional Medicare IM given:   Additional Medicare IM given by:    Discharge Disposition:  [place]  Per UR Regulation:    If discussed at Long Length of Stay Meetings, dates discussed:    Comments:  08/25/14 11:30 CM met with pt in room to offer choice of home health agency.  Pt chooses [facility] to render HHPT/OT/RN services.  Private Duty Agency List was offered but pt states he has enough people to help him out (girlfriend in room and confirms).  Referral called to [facility] rep, Kristen. No other CM needs were communicated.  Mariane Masters, Durel Salts [phone].   08/23/14 13:20 CM to follow for home health needs in anticipation of possible home IV ABX.  Mariane Masters, BSn, Cm [phone].

## 2014-08-23 NOTE — Progress Notes (Addendum)
PROGRESS NOTE  Cody [name] JXB:[id number] DOB: 04-21-1946 DOA: 08/20/2014 PCP: Hollice EspyGATES,DONNA RUTH, MD  Brief history 68 year old male with a history of diabetes mellitus, hypertension, Parkinson's disease presents with one-day history of right flank pain that developed in the early morning hours of 08/19/2014. The patient had some subjective fever and chills with progressive right flank pain with associated nausea and vomiting. As a result, he presented to the emergency department where the patient was found have temperature 102.102F. CT of the abdomen and pelvis revealed a 1.2 cm right UPJ stone. The patient had WBC 12.9 with serum creatinine of 1.63 at the time of presentation. The patient was started on ceftriaxone after urine and blood cultures were obtained. Urinalysis shows 11-20 WBC. When the patient's preliminary blood cultures grew gram-negative rods, the patient was empirically Switched from ceftriaxone to imipenem pending final susceptibilities. Dr. Vernie Ammonsttelin continued to follow the patient, and he took the patient to OR on 08/22/14 for cystoscopy and ureteral stent placement. Assessment/Plan: Sepsis -present at the time of admission -Secondary to bacteremia -Source of bacteremia is genitourinary -Broaden IV antibiotics pending blood culture data -Discontinue ceftriaxone -Start imipenem as the patient has swelling with penicillin -Surveillance blood cultures 08/23/2014 Bacteremia/pyelitis -Urine culture growing Klebsiella oxytoca -anticipate blood culture will be same organism -continue imipenem pending final blood culture data -if 08/23/2014 surveillance blood culture remains negative, and isolate is Klebsiella sensitive to quinolones, patient can likely go home with oral quinolones to complete antibiotics through 09/05/2014 which would be 14 days from the last negative culture Nephrolithiasis/obstructive uropathy -Appreciate Dr. Vernie Ammonsttelin -08/22/14--cystoscopy and  ureteral stent -continue abx pending culture data -08/20/2014 CT abdomen and pelvis--R-UPJ stone AKI -Baseline creatinine 0.9-1.1 -Likely multifactorial including volume depletion in the setting of ACEi/HCTZ, NSAIDs/toradol (in ED), sepsis, and obstructive uropathy -patient also took 9 tablets of ibuprofen on the day he came to hospital -Discontinue NSAIDs, ACEi, HCTZ -Judicious hydration -Avoid nephrotoxic agents -renal function--appears to be at plateau today -Vomiting has improved Dyspnea/Fluid overload -stable on 2L-->100%-->wean for oxygen sat >92% -08/23/14--repeat CXR--b/l pulm opacities, pulm edema -saline lock IVF -lasix IV x 1 40 mg -echo -has been using SCDs Diabetes mellitus type 2 -Discontinue glipizide -NovoLog sliding scale -Hemoglobin A1c--7.5 -d/c metformin/glipizide Hypertension -Hold atenolol, lisinopril, HCTZ as blood pressure is soft Parkinson's disease -continue Stalevo Depression -Continue Zoloft Pulmonary nodules -Incidental finding on CT of the abdomen and pelvis -Will need outpatient surveillance Thrombocytopenia -Patient appears to have mild thrombocytopenia on previous labs -Check INR, PTT, fibrinogen--not suggestive of DIC -SCDs -continue to trend -has not received heparin products Family Communication: significant other updated at bedside Disposition Plan: Home when medically stable  Antibiotics:  Ceftriaxone 08/20/2014>>>08/22/14  Imipenem 08/22/14>>>    Procedures/Studies: Ct Abdomen Pelvis Wo Contrast  08/20/2014   CLINICAL DATA:  Right flank pain  EXAM: CT ABDOMEN AND PELVIS WITHOUT CONTRAST  TECHNIQUE: Multidetector CT imaging of the abdomen and pelvis was performed following the standard protocol without IV contrast.  COMPARISON:  None.  FINDINGS: Lower chest: There are multiple small nodules identified in both lungs. Index nodule in the right middle lobe measures 5 mm, image 9 of series 203. Lingular nodule measures 4 mm,  image 13/ series 203. No pleural effusion noted  Hepatobiliary: Diffuse fatty infiltration of the liver. The gallbladder is normal. There is no biliary dilatation. Normal appearance of the pancreas.  Pancreas: Normal appearance of the pancreas.  Spleen: The spleen is on unremarkable.  Adrenals/Urinary Tract: The  adrenal glands are both normal. 3 mm nonobstructing calculus is identified within the upper pole the left kidney. Low-attenuation structure in the left kidney measures 1.8 cm and is incompletely characterized without IV contrast. There is a 5 mm stone within the lower pole of the right kidney. At the right UPJ there is a 1.2 by 1.1 cm stone. This causes mild right hydronephrosis. The urinary bladder appears normal.  Stomach/Bowel: The stomach is normal. The small bowel loops have a normal course and caliber. No obstruction. Normal appearance of the colon.  Vascular/Lymphatic: Calcified atherosclerotic disease involves the abdominal aorta. There is no aneurysm. There is no retroperitoneal adenopathy identified no pelvic or inguinal adenopathy noted.  Reproductive: Prostate gland and seminal vesicles appear normal.  Other: There is no free fluid or fluid collections identified within the abdomen or pelvis.  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 There are bilateral L5 pars defects identified. Anterolisthesis of L5 on S1 appears normal.  IMPRESSION: 1. Large stone is identified at the right UPJ. This causes right-sided hydronephrosis. Smaller nonobstructing calculus is noted within the inferior pole collecting system of the right kidney. 2. Advanced lumbar spondylosis. 3. There are multiple indeterminate nodules identified within the lung bases. Consider followup common nonemergent CT of the chest for further investigation.   Electronically Signed   By: Signa Kellaylor  Stroud M.D.   On: 08/20/2014  22:02   Dg Chest 2 View  08/21/2014   CLINICAL DATA:  Right-sided flank pain with fever.  Kidney stone.  EXAM: CHEST  2 VIEW  COMPARISON:  01/02/2014  FINDINGS: Normal heart size. No pleural effusion identified. There is no airspace consolidation. Asymmetric elevation of the right hemidiaphragm is identified. No pleural effusion or edema identified. There is no airspace consolidation. Spondylosis identified within the thoracic spine.  IMPRESSION: 1. No acute findings. 2. Asymmetric elevation of right hemidiaphragm.   Electronically Signed   By: Signa Kellaylor  Stroud M.D.   On: 08/21/2014 00:06         Subjective: Patient complains of some dyspnea with exertion. He denies any chest pain, fevers, chills, nausea, vomiting, diarrhea. He feels constipated. Abdominal pain and right flank pain are improving.  Objective: Filed Vitals:   08/22/14 2200 08/23/14 0625 08/23/14 0700 08/23/14 1401  BP: 118/69 165/75 124/73 127/81  Pulse: 107 98 100 98  Temp: 98.2 F (36.8 C) 98.4 F (36.9 C) 98.7 F (37.1 C) 98.4 F (36.9 C)  TempSrc: Oral Oral Oral Oral  Resp: 20 20 23 20   Height:      Weight:      SpO2: 92% 94% 99% 97%    Intake/Output Summary (Last 24 hours) at 08/23/14 1550 Last data filed at 08/23/14 0835  Gross per 24 hour  Intake 853.75 ml  Output   1875 ml  Net -1021.25 ml   Weight change:  Exam:   General:  Pt is alert, follows commands appropriately, not in acute distress  HEENT: No icterus, No thrush,[place]/AT  Cardiovascular: RRR, S1/S2, no rubs, no gallops  Respiratory: fine bibasilar crackles, no wheeze, good air movement  Abdomen: Soft/+BS, non tender, non distended, no guarding  Extremities: trace LE edema, No lymphangitis, No petechiae, No rashes, no synovitis  Data Reviewed: Basic Metabolic Panel:  Recent Labs Lab 08/20/14 2003 08/21/14 0723 08/22/14 0506 08/23/14 0625  NA 134* 136*  140 131* 130*  K 4.0 3.9  4.0 4.0 4.3  CL  94* 99  102 95* 95*  CO2 25  18*  19 20 18*  GLUCOSE 202* 167*  166* 113* 160*  BUN 26* 35*  35* 44* 38*  CREATININE 1.63* 2.57*  2.59* 2.52* 1.71*  CALCIUM 9.1 8.3*  8.1* 7.7* 8.0*   Liver Function Tests:  Recent Labs Lab 08/20/14 2003 08/21/14 0723  AST 27 27  ALT 6 20  ALKPHOS 68 65  BILITOT 1.1 1.0  PROT 7.5 6.0  ALBUMIN 4.1 3.3*    Recent Labs Lab 08/20/14 2003  LIPASE 22   No results for input(s): AMMONIA in the last 168 hours. CBC:  Recent Labs Lab 08/20/14 2003 08/21/14 0723 08/22/14 0506 08/23/14 0625  WBC 12.9* 9.2 14.0* 13.3*  NEUTROABS 11.1*  --   --   --   HGB 14.3 13.6 13.1 13.2  HCT 42.5 40.9 38.7* 38.7*  MCV 88.2 89.9 88.4 87.8  PLT 184 120* 114* 92*   Cardiac Enzymes: No results for input(s): CKTOTAL, CKMB, CKMBINDEX, TROPONINI in the last 168 hours. BNP: Invalid input(s): POCBNP CBG:  Recent Labs Lab 08/22/14 1517 08/22/14 1700 08/22/14 2221 08/23/14 0720 08/23/14 1142  GLUCAP 122* 129* 173* 164* 143*    Recent Results (from the past 240 hour(s))  Urine culture     Status: None   Collection Time: 08/20/14  8:09 PM  Result Value Ref Range Status   Specimen Description URINE, RANDOM  Final   Special Requests ADDED 2110  Final   Culture  Setup Time   Final    08/21/2014 14:28 Performed at [facility]    Colony Count   Final    >=100,000 COLONIES/ML Performed at [facility]    Culture   Final    KLEBSIELLA OXYTOCA Performed at [facility]    Report Status 08/23/2014 FINAL  Final   Organism ID, Bacteria KLEBSIELLA OXYTOCA  Final      Susceptibility   Klebsiella oxytoca - MIC*    AMPICILLIN >=32 RESISTANT Resistant     CEFAZOLIN 32 RESISTANT Resistant     CEFTRIAXONE <=1 SENSITIVE Sensitive     CIPROFLOXACIN <=0.25 SENSITIVE Sensitive     GENTAMICIN <=1 SENSITIVE Sensitive     LEVOFLOXACIN <=0.12 SENSITIVE Sensitive     NITROFURANTOIN <=16 SENSITIVE Sensitive     TOBRAMYCIN <=1 SENSITIVE Sensitive     TRIMETH/SULFA  <=20 SENSITIVE Sensitive     PIP/TAZO <=4 SENSITIVE Sensitive     * KLEBSIELLA OXYTOCA  Culture, blood (routine x 2)     Status: None (Preliminary result)   Collection Time: 08/20/14 10:25 PM  Result Value Ref Range Status   Specimen Description BLOOD LEFT ARM  Final   Special Requests BOTTLES DRAWN AEROBIC AND ANAEROBIC 5CC EA  Final   Culture  Setup Time   Final    08/21/2014 04:11 Performed at [facility]    Culture   Final    GRAM NEGATIVE RODS Note: CRITICAL RESULT CALLED TO, READ BACK BY AND VERIFIED WITH: Orlean Bradford RN @ 1040PM Payton Doughty [id number] Performed at [facility]    Report Status PENDING  Incomplete  Culture, blood (routine x 2)     Status: None (Preliminary result)   Collection Time: 08/20/14 10:31 PM  Result Value Ref Range Status   Specimen Description BLOOD LEFT FOREARM  Final   Special Requests BOTTLES DRAWN AEROBIC AND ANAEROBIC [facility] EACH  Final   Culture  Setup Time   Final    08/21/2014 04:11  Performed at [facility] NEGATIVE RODS Note: Gram Stain Report Called to,Read Back By and Verified With: VERA JACKSON @ 3:53AM 11.2.15 BY DESIS Performed at [facility]    Report Status PENDING  Incomplete  Urine culture     Status: None   Collection Time: 08/21/14 12:51 PM  Result Value Ref Range Status   Specimen Description URINE, RANDOM  Final   Special Requests NONE  Final   Culture  Setup Time   Final    08/21/2014 23:29 Performed at [facility]    Colony Count NO GROWTH Performed at [facility]   Final   Culture NO GROWTH Performed at [facility]   Final   Report Status 08/23/2014 FINAL  Final  MRSA PCR Screening     Status: None   Collection Time: 08/22/14 10:14 AM  Result Value Ref Range Status   MRSA by PCR NEGATIVE NEGATIVE Final    Comment:        The GeneXpert MRSA Assay (FDA approved for NASAL specimens only), is one component of a comprehensive MRSA  colonization surveillance program. It is not intended to diagnose MRSA infection nor to guide or monitor treatment for MRSA infections.   Culture, expectorated sputum-assessment     Status: None   Collection Time: 08/22/14  7:05 PM  Result Value Ref Range Status   Specimen Description SPUTUM  Final   Special Requests Normal  Final   Sputum evaluation   Final    THIS SPECIMEN IS ACCEPTABLE. RESPIRATORY CULTURE REPORT TO FOLLOW.   Report Status 08/22/2014 FINAL  Final  Culture, respiratory (NON-Expectorated)     Status: None (Preliminary result)   Collection Time: 08/22/14  7:05 PM  Result Value Ref Range Status   Specimen Description SPUTUM  Final   Special Requests NONE  Final   Gram Stain   Final    ABUNDANT WBC PRESENT, PREDOMINANTLY PMN NO SQUAMOUS EPITHELIAL CELLS SEEN NO ORGANISMS SEEN Performed at [facility]    Culture   Final    NORMAL OROPHARYNGEAL FLORA Performed at [facility]    Report Status PENDING  Incomplete     Scheduled Meds: . carbidopa-levodopa  2 tablet Oral 4 times per day   And  . entacapone  200 mg Oral 4 times per day  . cholecalciferol  3,000 Units Oral Daily  . imipenem-cilastatin  250 mg Intravenous Q6H  . insulin aspart  0-5 Units Subcutaneous QHS  . insulin aspart  0-9 Units Subcutaneous TID WC  . mirabegron ER  25 mg Oral Daily  . sertraline  100 mg Oral Daily  . sodium chloride  3 mL Intravenous Q12H   Continuous Infusions: . sodium chloride 75 mL/hr at 08/23/14 1535     Atticus Lemberger, DO  [facility] Pager [phone]  If 7PM-7AM, please contact night-coverage [email or web address] Password [email or web address] 08/23/2014, 3:50 PM   LOS: 3 days

## 2014-08-23 NOTE — Progress Notes (Signed)
[facility]  [facility]   [facility] is providing the following services: HHRN and Home Infusion Pharmacy for home IV ABX. [facility] will continue to follow pt daily while in house and support transition home when deemed appropriate by MD.  [facility] will provide in hospital teaching re: home IV ABX for independence at home.    If patient discharges after hours, please call [phone].   Sedalia Mutaamela S Chandler 08/23/2014, 4:51 PM

## 2014-08-23 NOTE — Progress Notes (Signed)
Patient ID: Cody GrateCarlas Behrendt, male   DOB: Mar 17, 1946, 68 y.o.   MRN: [id number]  Subjective: The patient reports Experiencing urinary frequency and small frequent voidings.  He does not have any dysuria.  He said he also has some mild discomfort in his back occasionally but no severe flank pain.  He denies any nausea or vomiting but has very little appetite.  He is passing flatus but reports being constipated.  Objective: Vital signs in last 24 hours: Temp:  [98.2 F (36.8 C)-102.5 F (39.2 C)] 98.4 F (36.9 C) (11/03 0625) Pulse Rate:  [98-112] 98 (11/03 0625) Resp:  [16-28] 20 (11/03 0625) BP: (110-165)/(68-75) 165/75 mmHg (11/03 0625) SpO2:  [91 %-100 %] 94 % (11/03 0625)A  Intake/Output from previous day: 11/02 0701 - 11/03 0700 In: 2113.8 [P.O.:120; I.V.:1993.8] Out: 1625 [Urine:1625] Intake/Output this shift: Total I/O In: -  Out: 1500 [Urine:1500]  Past Medical History  Diagnosis Date  . Back pain     lumbar  . Urgency of urination   . Hammer toe   . Diabetes mellitus   . Unspecified essential hypertension   . Parkinson disease     Physical Exam:  Lungs - Normal respiratory effort, chest expands symmetrically.  Abdomen - Soft, non-tender & non-distended.  Lab Results:  Recent Labs  08/21/14 0723 08/22/14 0506 08/23/14 0625  WBC 9.2 14.0* 13.3*  HGB 13.6 13.1 13.2  HCT 40.9 38.7* 38.7*   BMET  Recent Labs  08/21/14 0723 08/22/14 0506  NA 136*  140 131*  K 3.9  4.0 4.0  CL 99  102 95*  CO2 18*  19 20  GLUCOSE 167*  166* 113*  BUN 35*  35* 44*  CREATININE 2.57*  2.59* 2.52*  CALCIUM 8.3*  8.1* 7.7*   No results for input(s): LABURIN in the last 72 hours. Results for orders placed or performed during the hospital encounter of 08/20/14  Urine culture     Status: None (Preliminary result)   Collection Time: 08/20/14  8:09 PM  Result Value Ref Range Status   Specimen Description URINE, RANDOM  Final   Special Requests ADDED 2110  Final    Culture  Setup Time   Final    08/21/2014 14:28 [facility]    Colony Count   Final    >=100,000 COLONIES/ML [facility]    Culture   Final    GRAM NEGATIVE RODS [facility] at [facility]    Report Status PENDING  Incomplete  Culture, blood (routine x 2)     Status: None (Preliminary result)   Collection Time: 08/20/14 10:25 PM  Result Value Ref Range Status   Specimen Description BLOOD LEFT ARM  Final   Special Requests BOTTLES DRAWN AEROBIC AND ANAEROBIC 5CC EA  Final   Culture  Setup Time   Final    08/21/2014 04:11 [facility]formed at [facility]    Culture   Final    GRAM NEGATIVE RODS Note: CRITICAL RESULT CALLED TO, READ BACK BY AND VERIFIED WITH: Orlean BradfordVERA JACKSON RN @ 1040PM Payton DoughtyVINCJ [id number] [facility]formed at [facility]    Report Status PENDING  Incomplete  Culture, blood (routine x 2)     Status: None (Preliminary result)   Collection Time: 08/20/14 10:31 PM  Result Value Ref Range Status   Specimen Description BLOOD LEFT FOREARM  Final   Special Requests BOTTLES DRAWN AEROBIC AND ANAEROBIC 5CC EACH  Final   Culture  Setup Time  Final    08/21/2014 04:11 [facility]formed at [facility]    Culture   Final    GRAM VARIABLE ROD Note: Gram Stain Report Called to,Read Back By and Verified With: VERA JACKSON @ 3:53AM 11.2.15 BY DESIS Performed at [facility]    Report Status PENDING  Incomplete  Urine culture     Status: None   Collection Time: 08/21/14 12:51 PM  Result Value Ref Range Status   Specimen Description URINE, RANDOM  Final   Special Requests NONE  Final   Culture  Setup Time   Final    08/21/2014 23:29 [facility]formed at [facility]    Colony Count NO GROWTH [facility]   Final   Culture NO GROWTH [facility]   Final   Report Status 08/23/2014 FINAL  Final  MRSA PCR Screening     Status: None   Collection Time: 08/22/14 10:14 AM   Result Value Ref Range Status   MRSA by PCR NEGATIVE NEGATIVE Final    Comment:        The GeneXpert MRSA Assay (FDA approved for NASAL specimens only), is one component of a comprehensive MRSA colonization surveillance program. It is not intended to diagnose MRSA infection nor to guide or monitor treatment for MRSA infections.   Culture, expectorated sputum-assessment     Status: None   Collection Time: 08/22/14  7:05 PM  Result Value Ref Range Status   Specimen Description SPUTUM  Final   Special Requests Normal  Final   Sputum evaluation   Final    THIS SPECIMEN IS ACCEPTABLE. RESPIRATORY CULTURE REPORT TO FOLLOW.   Report Status 08/22/2014 FINAL  Final    Studies/Results: No results found.  Assessment: He is doing fairly well after having his stent placed.  It has improved his flank pain and he is currently afebrile. The stent is causing some bladder irritation. His white blood cell count has decreased and his creatinine currently remains pending.  Blood cultures show gram-negative rods and urine culture appears to be positive as well.  He is on appropriate broad-spectrum antibiotics.  Plan: 1.  Continue broad-spectrum antibiotics. 2.  I will begin Pyridium at a reduced dose due to his renal insufficiency. 3.  Begin Myrbetriq 25 mg for bladder irritation from the stent.  Addalie Calles C 08/23/2014, 6:53 AM

## 2014-08-23 NOTE — Progress Notes (Signed)
RT placed pt on auto titrate CPAP 6-20cmH2O with 4L of oxygen bled in via ffm. Sterile water was added for humidification. Pt is resting comfortably and tolerating CPAP well at this time. RT will continue to monitor as needed.

## 2014-08-24 DIAGNOSIS — A4159 Other Gram-negative sepsis: Secondary | ICD-10-CM

## 2014-08-24 DIAGNOSIS — I517 Cardiomegaly: Secondary | ICD-10-CM

## 2014-08-24 DIAGNOSIS — K5900 Constipation, unspecified: Secondary | ICD-10-CM

## 2014-08-24 DIAGNOSIS — R0609 Other forms of dyspnea: Secondary | ICD-10-CM

## 2014-08-24 DIAGNOSIS — L270 Generalized skin eruption due to drugs and medicaments taken internally: Secondary | ICD-10-CM

## 2014-08-24 DIAGNOSIS — A414 Sepsis due to anaerobes: Secondary | ICD-10-CM

## 2014-08-24 LAB — CULTURE, BLOOD (ROUTINE X 2)

## 2014-08-24 LAB — CULTURE, RESPIRATORY W GRAM STAIN

## 2014-08-24 LAB — GLUCOSE, CAPILLARY
GLUCOSE-CAPILLARY: 188 mg/dL — AB (ref 70–99)
Glucose-Capillary: 183 mg/dL — ABNORMAL HIGH (ref 70–99)
Glucose-Capillary: 155 mg/dL — ABNORMAL HIGH (ref 70–99)

## 2014-08-24 LAB — BASIC METABOLIC PANEL
ANION GAP: 16 — AB (ref 5–15)
BUN: 32 mg/dL — ABNORMAL HIGH (ref 6–23)
CO2: 22 mEq/L (ref 19–32)
CREATININE: 1.53 mg/dL — AB (ref 0.50–1.35)
Calcium: 8.6 mg/dL (ref 8.4–10.5)
Chloride: 93 mEq/L — ABNORMAL LOW (ref 96–112)
GFR calc non Af Amer: 45 mL/min — ABNORMAL LOW (ref >90)
GFR, EST AFRICAN AMERICAN: 52 mL/min — AB (ref >90)
Glucose, Bld: 187 mg/dL — ABNORMAL HIGH (ref 70–99)
POTASSIUM: 3.9 meq/L (ref 3.7–5.3)
Sodium: 131 mEq/L — ABNORMAL LOW (ref 137–147)

## 2014-08-24 LAB — CBC
HCT: 42.1 % (ref 39.0–52.0)
Hemoglobin: 14.5 g/dL (ref 13.0–17.0)
MCH: 30.1 pg (ref 26.0–34.0)
MCHC: 34.4 g/dL (ref 30.0–36.0)
MCV: 87.3 fL (ref 78.0–100.0)
PLATELETS: 93 10*3/uL — AB (ref 150–400)
RBC: 4.82 MIL/uL (ref 4.22–5.81)
RDW: 13.5 % (ref 11.5–15.5)
WBC: 13.6 10*3/uL — ABNORMAL HIGH (ref 4.0–10.5)

## 2014-08-24 LAB — CULTURE, RESPIRATORY: CULTURE: NORMAL

## 2014-08-24 MED ORDER — CIPROFLOXACIN HCL 500 MG PO TABS
500.0000 mg | ORAL_TABLET | Freq: Two times a day (BID) | ORAL | Status: DC
  Administered 2014-08-24 – 2014-08-25 (×3): 500 mg via ORAL
  Filled 2014-08-24 (×5): qty 1

## 2014-08-24 MED ORDER — DOCUSATE SODIUM 100 MG PO CAPS
100.0000 mg | ORAL_CAPSULE | Freq: Two times a day (BID) | ORAL | Status: DC
  Administered 2014-08-24 – 2014-08-25 (×2): 100 mg via ORAL
  Filled 2014-08-24 (×3): qty 1

## 2014-08-24 MED ORDER — POLYETHYLENE GLYCOL 3350 17 G PO PACK
17.0000 g | PACK | Freq: Every day | ORAL | Status: DC
  Filled 2014-08-24: qty 1

## 2014-08-24 MED ORDER — SENNA 8.6 MG PO TABS
2.0000 | ORAL_TABLET | Freq: Every day | ORAL | Status: DC
  Administered 2014-08-24: 17.2 mg via ORAL
  Filled 2014-08-24: qty 2

## 2014-08-24 MED ORDER — POLYETHYLENE GLYCOL 3350 17 G PO PACK
34.0000 g | PACK | Freq: Once | ORAL | Status: AC
  Administered 2014-08-24: 34 g via ORAL
  Filled 2014-08-24: qty 2

## 2014-08-24 MED ORDER — POLYETHYLENE GLYCOL 3350 17 G PO PACK
17.0000 g | PACK | Freq: Every day | ORAL | Status: DC
  Administered 2014-08-25: 17 g via ORAL
  Filled 2014-08-24: qty 1

## 2014-08-24 MED ORDER — MORPHINE SULFATE 4 MG/ML IJ SOLN: 2.0000 mg | INTRAMUSCULAR | Status: DC | PRN

## 2014-08-24 MED ORDER — HYDROCORTISONE 1 % EX LOTN
TOPICAL_LOTION | Freq: Three times a day (TID) | CUTANEOUS | Status: DC | PRN
  Filled 2014-08-24: qty 118

## 2014-08-24 MED ORDER — FUROSEMIDE 10 MG/ML IJ SOLN
40.0000 mg | Freq: Once | INTRAMUSCULAR | Status: AC
  Administered 2014-08-24: 40 mg via INTRAVENOUS
  Filled 2014-08-24: qty 4

## 2014-08-24 MED ORDER — HYDROCODONE-ACETAMINOPHEN 5-325 MG PO TABS
1.0000 | ORAL_TABLET | ORAL | Status: DC | PRN
Start: 2014-08-24 — End: 2014-08-25

## 2014-08-24 NOTE — Progress Notes (Signed)
Patient ID: Cody GrateCarlas [name], male   DOB: January 05, 1946, 68 y.o.   MRN: [id number]  Subjective: The patient reports that he has no flank pain or bladder discomfort.  He told me that he cannot even feel his stent at this time.  He has no new urologic complaints other than having had significant frequency after having been administered Lasix.   Objective: Vital signs in last 24 hours: Temp:  [97.8 F (36.6 C)-99 F (37.2 C)] 99 F (37.2 C) (11/04 0700) Pulse Rate:  [92-100] 100 (11/04 0700) Resp:  [20] 20 (11/04 0700) BP: (125-141)/(72-81) 141/76 mmHg (11/04 0700) SpO2:  [97 %-99 %] 98 % (11/04 0700)A  Intake/Output from previous day: 11/03 0701 - 11/04 0700 In: 600 [P.O.:600] Out: 850 [Urine:850] Intake/Output this shift:    Past Medical History  Diagnosis Date  . Back pain     lumbar  . Urgency of urination   . Hammer toe   . Diabetes mellitus   . Unspecified essential hypertension   . Parkinson disease     Physical Exam:  Lungs - Normal respiratory effort, chest expands symmetrically.  Abdomen - Soft, non-tender & non-distended.  Lab Results:  Recent Labs  08/22/14 0506 08/23/14 0625 08/24/14 0503  WBC 14.0* 13.3* 13.6*  HGB 13.1 13.2 14.5  HCT 38.7* 38.7* 42.1   BMET  Recent Labs  08/23/14 0625 08/24/14 0503  NA 130* 131*  K 4.3 3.9  CL 95* 93*  CO2 18* 22  GLUCOSE 160* 187*  BUN 38* 32*  CREATININE 1.71* 1.53*  CALCIUM 8.0* 8.6   No results for input(s): LABURIN in the last 72 hours. Results for orders placed or performed during the hospital encounter of 08/20/14  Urine culture     Status: None   Collection Time: 08/20/14  8:09 PM  Result Value Ref Range Status   Specimen Description URINE, RANDOM  Final   Special Requests ADDED 2110  Final   Culture  Setup Time   Final    08/21/2014 14:28 Performed at [facility]    Colony Count   Final    >=100,000 COLONIES/ML Performed at [facility]    Culture   Final    KLEBSIELLA  OXYTOCA Performed at [facility]    Report Status 08/23/2014 FINAL  Final   Organism ID, Bacteria KLEBSIELLA OXYTOCA  Final      Susceptibility   Klebsiella oxytoca - MIC*    AMPICILLIN >=32 RESISTANT Resistant     CEFAZOLIN 32 RESISTANT Resistant     CEFTRIAXONE <=1 SENSITIVE Sensitive     CIPROFLOXACIN <=0.25 SENSITIVE Sensitive     GENTAMICIN <=1 SENSITIVE Sensitive     LEVOFLOXACIN <=0.12 SENSITIVE Sensitive     NITROFURANTOIN <=16 SENSITIVE Sensitive     TOBRAMYCIN <=1 SENSITIVE Sensitive     TRIMETH/SULFA <=20 SENSITIVE Sensitive     PIP/TAZO <=4 SENSITIVE Sensitive     * KLEBSIELLA OXYTOCA  Culture, blood (routine x 2)     Status: None   Collection Time: 08/20/14 10:25 PM  Result Value Ref Range Status   Specimen Description BLOOD LEFT ARM  Final   Special Requests BOTTLES DRAWN AEROBIC AND ANAEROBIC 5CC EA  Final   Culture  Setup Time   Final    08/21/2014 04:11 Performed at [facility]    Culture   Final    KLEBSIELLA OXYTOCA Note: CRITICAL RESULT CALLED TO, READ BACK BY AND VERIFIED WITH: Orlean BradfordVERA JACKSON RN @ 1040PM VINCJ [id number] SUSCEPTIBILITIES  PERFORMED ON PREVIOUS CULTURE WITHIN THE LAST 5 DAYS. Performed at [facility]    Report Status 08/24/2014 FINAL  Final  Culture, blood (routine x 2)     Status: None   Collection Time: 08/20/14 10:31 PM  Result Value Ref Range Status   Specimen Description BLOOD LEFT FOREARM  Final   Special Requests BOTTLES DRAWN AEROBIC AND ANAEROBIC [facility] EACH  Final   Culture  Setup Time   Final    08/21/2014 04:11 Performed at [facility]    Culture   Final    KLEBSIELLA OXYTOCA Note: Gram Stain Report Called to,Read Back By and Verified With: VERA JACKSON @ 3:53AM 11.2.15 BY DESIS Performed at [facility]    Report Status 08/24/2014 FINAL  Final   Organism ID, Bacteria KLEBSIELLA OXYTOCA  Final      Susceptibility   Klebsiella oxytoca - MIC*    AMPICILLIN >=32 RESISTANT Resistant      AMPICILLIN/SULBACTAM 16 INTERMEDIATE Intermediate     CEFAZOLIN >=64 RESISTANT Resistant     CEFEPIME <=1 SENSITIVE Sensitive     CEFTAZIDIME <=1 SENSITIVE Sensitive     CEFTRIAXONE <=1 SENSITIVE Sensitive     CIPROFLOXACIN <=0.25 SENSITIVE Sensitive     GENTAMICIN <=1 SENSITIVE Sensitive     IMIPENEM 0.5 SENSITIVE Sensitive     PIP/TAZO <=4 SENSITIVE Sensitive     TOBRAMYCIN <=1 SENSITIVE Sensitive     TRIMETH/SULFA <=20 SENSITIVE Sensitive     * KLEBSIELLA OXYTOCA  Urine culture     Status: None   Collection Time: 08/21/14 12:51 PM  Result Value Ref Range Status   Specimen Description URINE, RANDOM  Final   Special Requests NONE  Final   Culture  Setup Time   Final    08/21/2014 23:29 Performed at [facility]    Colony Count NO GROWTH Performed at [facility]   Final   Culture NO GROWTH Performed at [facility]   Final   Report Status 08/23/2014 FINAL  Final  MRSA PCR Screening     Status: None   Collection Time: 08/22/14 10:14 AM  Result Value Ref Range Status   MRSA by PCR NEGATIVE NEGATIVE Final    Comment:        The GeneXpert MRSA Assay (FDA approved for NASAL specimens only), is one component of a comprehensive MRSA colonization surveillance program. It is not intended to diagnose MRSA infection nor to guide or monitor treatment for MRSA infections.   Culture, expectorated sputum-assessment     Status: None   Collection Time: 08/22/14  7:05 PM  Result Value Ref Range Status   Specimen Description SPUTUM  Final   Special Requests Normal  Final   Sputum evaluation   Final    THIS SPECIMEN IS ACCEPTABLE. RESPIRATORY CULTURE REPORT TO FOLLOW.   Report Status 08/22/2014 FINAL  Final  Culture, respiratory (NON-Expectorated)     Status: None   Collection Time: 08/22/14  7:05 PM  Result Value Ref Range Status   Specimen Description SPUTUM  Final   Special Requests NONE  Final   Gram Stain   Final    ABUNDANT WBC PRESENT,  PREDOMINANTLY PMN NO SQUAMOUS EPITHELIAL CELLS SEEN NO ORGANISMS SEEN Performed at [facility]    Culture   Final    NORMAL OROPHARYNGEAL FLORA Performed at [facility]    Report Status 08/24/2014 FINAL  Final    Studies/Results: Dg Chest 2 View  08/23/2014  CLINICAL DATA:  Shortness of breath and cough. Worsening for 1 week.  EXAM: CHEST  2 VIEW  COMPARISON:  Chest radiograph 08/20/2014  FINDINGS: Low lung volumes. Stable cardiac and mediastinal contours. Elevation right hemidiaphragm. Interval development of diffuse bilateral heterogeneous pulmonary opacities. Small bilateral pleural effusions. Mid thoracic spine degenerative change.  IMPRESSION: Diffuse bilateral pulmonary opacities may represent edema with associated small pleural effusions. Multi focal infection is not excluded.   Electronically Signed   By: Annia Beltrew  Davis M.D.   On: 08/23/2014 17:36    Assessment: He seems to be doing very well from a urologic standpoint.  He is tolerating his stent. His white count has remained stable and his creatinine is also stable but slightly elevated indicating, as I had suspected, some mild underlying degree of renal insufficiency. His urine culture has grown Klebsiella sensitive to Cipro.  I would suggest switching him to an oral agent such as Cipro to make sure he remains afebrile over the next 24 hours and then could be discharged home on that oral medication for a total of 10 more days. I discussed with he and his wife the treatment options for his stone.  Its size and location would lend itself nicely to lithotripsy.  I therefore have discussed that procedure with him in detail.  This will be scheduled as an outpatient once he's had a full course of antibiotic therapy.  Plan: 1.  Continue oral antibiotics for 10 days after discharge. 2.  He will follow-up with me upon discharge to schedule treatment of his stone. 3.  It would appear he does not need any further Pyridium and  therefore I will DC this. Cody [name] C 08/24/2014, 7:53 AM

## 2014-08-24 NOTE — Plan of Care (Signed)
Problem: Phase I Progression Outcomes Goal: Pain controlled with appropriate interventions Outcome: Completed/Met Date Met:  08/24/14     

## 2014-08-24 NOTE — Progress Notes (Addendum)
[facility] PROGRESS NOTE  Cody [name] ZOX:[id number] DOB: [birth year] DOA: 08/20/2014 PCP: [name], MD  Brief history  Brief history 68 year old male with a history of diabetes mellitus, hypertension, Parkinson's disease presents with one-day history of right flank pain that developed in the early morning hours of 08/19/2014. The patient had some subjective fever and chills with progressive right flank pain with associated nausea and vomiting. As a result, he presented to the emergency department where the patient was found have temperature 102.42F. CT of the abdomen and pelvis revealed a 1.2 cm right UPJ stone. The patient had WBC 12.9 with serum creatinine of 1.63 at the time of presentation. The patient was started on ceftriaxone after urine and blood cultures were obtained. Urinalysis shows 11-20 WBC. When the patient's preliminary blood cultures grew gram-negative rods, the patient was empirically Switched from ceftriaxone to imipenem pending final susceptibilities. Dr. Vernie Ammons continued to follow the patient, and he took the patient to OR on 08/22/14 for cystoscopy and ureteral stent placement.  Assessment/Plan  Klebsiella oxytoca septicemia secondary to pyelonephritis, present at the time of admission  -  Initial blood and urine cultures both growing Klebsiella - d/c imipenem and start ciprofloxacin - Surveillance blood cultures 08/23/2014 NGTD  Nephrolithiasis/obstructive uropathy -Appreciate Dr. Vernie Ammons -08/22/14--cystoscopy and ureteral stent -continue abx pending culture data -08/20/2014 CT abdomen and pelvis--R-UPJ stone  AKI, baseline creatinine 0.9-1.1 -Likely multifactorial including volume depletion in the setting of ACEi/HCTZ, NSAIDs/toradol (in ED), sepsis, and obstructive uropathy -patient also took 9 tablets of ibuprofen on the day he came to hospital -continue to hold NSAIDs, ACEi, HCTZ -Avoid nephrotoxic agent  Acute hypoxic respiratory failure  secondary to pulmonary edema, persistent rales and swelling on exam.  This may be secondary to fluid retention in setting of AKI and less likely acute on chronic diastolic heart failure -08/23/14--repeat CXR--b/l pulm opacities, pulm edema - repeat lasix IV 40 mg again today -echo:  Ejection fraction of 55-60% with normal systolic function, hypokinesis of the inferoseptal myocardium, grade 1 diastolic dysfunction -has been using SCDs - daily weights and strict I/O  Diabetes mellitus type 2, CBG near goal -Discontinue glipizide -NovoLog sliding scale -Hemoglobin A1c--7.5 -d/c metformin/glipizide  Hypertension -Hold atenolol, lisinopril, HCTZ as blood pressure is soft  Parkinson's disease -continue Stalevo  Depression -Continue Zoloft  Pulmonary nodules -Incidental finding on CT of the abdomen and pelvis -Will need outpatient surveillance  Thrombocytopenia -Patient appears to have mild thrombocytopenia on previous labs -Check INR, PTT, fibrinogen--not suggestive of DIC -SCDs -continue to trend -has not received heparin products  Drug rash -  D/c imipenem.  May also have cephalosporin intolerance. -  Start ciprofloxacin -  Hydrocortisone cream prn  Constipation -  Start colace, senna, miralax  Diet:  Diabetic diet Access:  PIV  IVF:  off Proph:  SCDs  Code Status: full Family Communication: patient and friend Disposition Plan: pending further diuresis and improvement in kidney function.  Will need HH services.   Consultants:  Urology, Dr. Vernie Ammons  Procedures: 08/22/14--cystoscopy and ureteral stent by Dr. Vernie Ammons  Antibiotics:  Ceftriaxone 08/20/2014>>>08/22/14  Imipenem 08/22/14>>>  HPI/Subjective:  Constipated.  Back is not very itchy, just feels warm to touch.      Objective: Filed Vitals:   08/23/14 1401 08/23/14 2122 08/23/14 2250 08/24/14 0700  BP: 127/81 125/72  141/76  Pulse: 98 94 92 100  Temp: 98.4 F (36.9 C) 97.8 F (36.6 C)  99 F (37.2  C)  TempSrc: Oral Oral  Oral  Resp: 20  20 20 20   Height:      Weight:      SpO2: 97% 99% 98% 98%    Intake/Output Summary (Last 24 hours) at 08/24/14 0731 Last data filed at 08/24/14 0236  Gross per 24 hour  Intake    600 ml  Output    850 ml  Net   -250 ml   Filed Weights   08/20/14 1954 08/21/14 0514  Weight: 108.41 kg (239 lb) 108.6 kg (239 lb 6.7 oz)    Exam:   General:  WM, No acute distress  HEENT:  NCAT, MMM  Cardiovascular:  RRR, nl S1, S2 no mrg, 2+ pulses, warm extremities  Respiratory:  Rales at bilateral bases, no rhonchi or wheeze, no increased WOB  Abdomen:   NABS, soft, NT/ND  MSK:   Normal tone and bulk, 1+ bilateral LEE  Neuro:  Grossly intact  Data Reviewed: Basic Metabolic Panel:  Recent Labs Lab 08/20/14 2003 08/21/14 0723 08/22/14 0506 08/23/14 0625 08/24/14 0503  NA 134* 136*  140 131* 130* 131*  K 4.0 3.9  4.0 4.0 4.3 3.9  CL 94* 99  102 95* 95* 93*  CO2 25 18*  19 20 18* 22  GLUCOSE 202* 167*  166* 113* 160* 187*  BUN 26* 35*  35* 44* 38* 32*  CREATININE 1.63* 2.57*  2.59* 2.52* 1.71* 1.53*  CALCIUM 9.1 8.3*  8.1* 7.7* 8.0* 8.6   Liver Function Tests:  Recent Labs Lab 08/20/14 2003 08/21/14 0723  AST 27 27  ALT 6 20  ALKPHOS 68 65  BILITOT 1.1 1.0  PROT 7.5 6.0  ALBUMIN 4.1 3.3*    Recent Labs Lab 08/20/14 2003  LIPASE 22   No results for input(s): AMMONIA in the last 168 hours. CBC:  Recent Labs Lab 08/20/14 2003 08/21/14 0723 08/22/14 0506 08/23/14 0625 08/24/14 0503  WBC 12.9* 9.2 14.0* 13.3* 13.6*  NEUTROABS 11.1*  --   --   --   --   HGB 14.3 13.6 13.1 13.2 14.5  HCT 42.5 40.9 38.7* 38.7* 42.1  MCV 88.2 89.9 88.4 87.8 87.3  PLT 184 120* 114* 92* 93*   Cardiac Enzymes: No results for input(s): CKTOTAL, CKMB, CKMBINDEX, TROPONINI in the last 168 hours. BNP (last 3 results) No results for input(s): PROBNP in the last 8760 hours. CBG:  Recent Labs Lab 08/22/14 2221 08/23/14 0720  08/23/14 1142 08/23/14 1650 08/23/14 2138  GLUCAP 173* 164* 143* 137* 188*    Recent Results (from the past 240 hour(s))  Urine culture     Status: None   Collection Time: 08/20/14  8:09 PM  Result Value Ref Range Status   Specimen Description URINE, RANDOM  Final   Special Requests ADDED 2110  Final   Culture  Setup Time   Final    08/21/2014 14:28 Performed at [facility]    Colony Count   Final    >=100,000 COLONIES/ML Performed at [facility]    Culture   Final    KLEBSIELLA OXYTOCA Performed at [facility]    Report Status 08/23/2014 FINAL  Final   Organism ID, Bacteria KLEBSIELLA OXYTOCA  Final      Susceptibility   Klebsiella oxytoca - MIC*    AMPICILLIN >=32 RESISTANT Resistant     CEFAZOLIN 32 RESISTANT Resistant     CEFTRIAXONE <=1 SENSITIVE Sensitive     CIPROFLOXACIN <=0.25 SENSITIVE Sensitive     GENTAMICIN <=1 SENSITIVE Sensitive     LEVOFLOXACIN <=  0.12 SENSITIVE Sensitive     NITROFURANTOIN <=16 SENSITIVE Sensitive     TOBRAMYCIN <=1 SENSITIVE Sensitive     TRIMETH/SULFA <=20 SENSITIVE Sensitive     PIP/TAZO <=4 SENSITIVE Sensitive     * KLEBSIELLA OXYTOCA  Culture, blood (routine x 2)     Status: None (Preliminary result)   Collection Time: 08/20/14 10:25 PM  Result Value Ref Range Status   Specimen Description BLOOD LEFT ARM  Final   Special Requests BOTTLES DRAWN AEROBIC AND ANAEROBIC 5CC EA  Final   Culture  Setup Time   Final    08/21/2014 04:11 Performed at [facility]    Culture   Final    GRAM NEGATIVE RODS Note: CRITICAL RESULT CALLED TO, READ BACK BY AND VERIFIED WITH: Orlean Bradford RN @ 1040PM Payton Doughty [id number] Performed at [facility]    Report Status PENDING  Incomplete  Culture, blood (routine x 2)     Status: None (Preliminary result)   Collection Time: 08/20/14 10:31 PM  Result Value Ref Range Status   Specimen Description BLOOD LEFT FOREARM  Final   Special Requests BOTTLES DRAWN AEROBIC  AND ANAEROBIC 5CC EACH  Final   Culture  Setup Time   Final    08/21/2014 04:11 Performed at [facility]    Culture   Final    GRAM NEGATIVE RODS Note: Gram Stain Report Called to,Read Back By and Verified With: VERA JACKSON @ 3:53AM 11.2.15 BY DESIS Performed at [facility]    Report Status PENDING  Incomplete  Urine culture     Status: None   Collection Time: 08/21/14 12:51 PM  Result Value Ref Range Status   Specimen Description URINE, RANDOM  Final   Special Requests NONE  Final   Culture  Setup Time   Final    08/21/2014 23:29 Performed at [facility]    Colony Count NO GROWTH Performed at [facility]   Final   Culture NO GROWTH Performed at [facility]   Final   Report Status 08/23/2014 FINAL  Final  MRSA PCR Screening     Status: None   Collection Time: 08/22/14 10:14 AM  Result Value Ref Range Status   MRSA by PCR NEGATIVE NEGATIVE Final    Comment:        The GeneXpert MRSA Assay (FDA approved for NASAL specimens only), is one component of a comprehensive MRSA colonization surveillance program. It is not intended to diagnose MRSA infection nor to guide or monitor treatment for MRSA infections.   Culture, expectorated sputum-assessment     Status: None   Collection Time: 08/22/14  7:05 PM  Result Value Ref Range Status   Specimen Description SPUTUM  Final   Special Requests Normal  Final   Sputum evaluation   Final    THIS SPECIMEN IS ACCEPTABLE. RESPIRATORY CULTURE REPORT TO FOLLOW.   Report Status 08/22/2014 FINAL  Final  Culture, respiratory (NON-Expectorated)     Status: None (Preliminary result)   Collection Time: 08/22/14  7:05 PM  Result Value Ref Range Status   Specimen Description SPUTUM  Final   Special Requests NONE  Final   Gram Stain   Final    ABUNDANT WBC PRESENT, PREDOMINANTLY PMN NO SQUAMOUS EPITHELIAL CELLS SEEN NO ORGANISMS SEEN Performed at [facility]    Culture   Final     NORMAL OROPHARYNGEAL FLORA Performed at [facility]    Report Status PENDING  Incomplete  Studies: Dg Chest 2 View  08/23/2014   CLINICAL DATA:  Shortness of breath and cough. Worsening for 1 week.  EXAM: CHEST  2 VIEW  COMPARISON:  Chest radiograph 08/20/2014  FINDINGS: Low lung volumes. Stable cardiac and mediastinal contours. Elevation right hemidiaphragm. Interval development of diffuse bilateral heterogeneous pulmonary opacities. Small bilateral pleural effusions. Mid thoracic spine degenerative change.  IMPRESSION: Diffuse bilateral pulmonary opacities may represent edema with associated small pleural effusions. Multi focal infection is not excluded.   Electronically Signed   By: Annia Beltrew  Davis M.D.   On: 08/23/2014 17:36    Scheduled Meds: . carbidopa-levodopa  2 tablet Oral 4 times per day   And  . entacapone  200 mg Oral 4 times per day  . cholecalciferol  3,000 Units Oral Daily  . imipenem-cilastatin  250 mg Intravenous Q6H  . insulin aspart  0-5 Units Subcutaneous QHS  . insulin aspart  0-9 Units Subcutaneous TID WC  . mirabegron ER  25 mg Oral Daily  . sertraline  100 mg Oral Daily  . sodium chloride  3 mL Intravenous Q12H   Continuous Infusions:   Principal Problem:   Sepsis Active Problems:   Essential hypertension   Parkinson's disease   Obstruction of right ureteropelvic junction (UPJ) due to stone   Leukocytosis   Acute renal failure   Thrombocytopenia   Bacteremia   Blood poisoning    Time spent: 30 min    Abcde Oneil, [facility]ACKENZIE  [facility] Pager [phone]. If 7PM-7AM, please contact night-coverage at [email or web address], password [email or web address] 08/24/2014, 7:31 AM  LOS: 4 days

## 2014-08-24 NOTE — Evaluation (Signed)
Physical Therapy Evaluation Patient Details Name: Cody [name] MRN: [id number] DOB: 1945/11/13 Today's Date: 08/24/2014   History of Present Illness  68 year old male with past medical history of diabetes, hypertension, Parkinson's disease, obesity who [facility] with worsening pain in right flank area for past few days prior to this admission and worsening over past 24 hours. Pt s/p cystoscopy and Right ureteral stent placement.  Clinical Impression  Pt admitted with flank pain and now s/p ureteral stent placement. Pt currently with functional limitations due to the deficits listed below (see PT Problem List). Pt will benefit from skilled PT to increase their independence and safety with mobility to allow discharge to the venue listed below. Pt presents with decreased step length with use of RW.  Pt states he normally ambulates better but due to Parkinson's and not walking in a few days that he feels that gait has been impaired.  He has a girlfriend who lives next door who can A 24 hours/ day initially.     Follow Up Recommendations Home health PT    Equipment Recommendations  None recommended by PT    Recommendations for Other Services       Precautions / Restrictions Precautions Precautions: Fall      Mobility  Bed Mobility               General bed mobility comments: up in recliner upon arrival  Transfers Overall transfer level: Needs assistance Equipment used: Rolling walker (2 wheeled) Transfers: Sit to/from Stand Sit to Stand: Min guard         General transfer comment: stood from recliner and toilet.  Initial stand from toilet with slight posterior lean,but able to get COG over BOS  Ambulation/Gait Ambulation/Gait assistance: Min guard Ambulation Distance (Feet): 100 Feet Assistive device: Rolling walker (2 wheeled) Gait Pattern/deviations: Shuffle;Decreased step length - left;Decreased step length - right     General Gait Details: Pt  with shuffeling gait pattern with RW.  He reports that he normally has increased step length even without RW and that this is because he hasn't walked in a few days.  Stairs            Wheelchair Mobility    Modified Rankin (Stroke Patients Only)       Balance Overall balance assessment: Needs assistance         Standing balance support: Bilateral upper extremity supported Standing balance-Leahy Scale: Poor                               Pertinent Vitals/Pain Pain Assessment: No/denies pain    Home Living Family/patient expects to be discharged to:: Private residence Living Arrangements: Alone Available Help at Discharge: Available 24 hours/day;Family;Friend(s) Type of Home: Apartment Home Access: Elevator     Home Layout: One level Home Equipment: Environmental consultantWalker - 4 wheels;Bedside commode;Shower seat Additional Comments: handicapped apartment    Prior Function Level of Independence: Independent               Hand Dominance        Extremity/Trunk Assessment   Upper Extremity Assessment: Overall WFL for tasks assessed           Lower Extremity Assessment: Overall WFL for tasks assessed      Cervical / Trunk Assessment: Normal  Communication   Communication: No difficulties  Cognition Arousal/Alertness: Awake/alert Behavior During Therapy: WFL for tasks assessed/performed Overall Cognitive Status: Within Functional  Limits for tasks assessed                      General Comments      Exercises        Assessment/Plan    PT Assessment Patient needs continued PT services  PT Diagnosis Difficulty walking   PT Problem List Decreased balance;Decreased mobility;Decreased knowledge of use of DME  PT Treatment Interventions Gait training;Functional mobility training;Therapeutic activities;Therapeutic exercise   PT Goals (Current goals can be found in the Care Plan section) Acute Rehab PT Goals Patient Stated Goal: Return  home PT Goal Formulation: With patient Time For Goal Achievement: 09/07/14 Potential to Achieve Goals: Good    Frequency Min 3X/week   Barriers to discharge        Co-evaluation               End of Session Equipment Utilized During Treatment: Gait belt Activity Tolerance: Patient tolerated treatment well Patient left: in chair;with family/visitor present;with call bell/phone within reach Nurse Communication: Mobility status         Time: [id number] PT Time Calculation (min): 23 min   Charges:   PT Evaluation $Initial PT Evaluation Tier I: 1 Procedure PT Treatments $Gait Training: 8-22 mins   PT G Codes:          Darcey Cardy LUBECK 08/24/2014, 12:42 PM

## 2014-08-24 NOTE — Progress Notes (Signed)
Patient taken off oxygen, and oxygen saturations remained at 96 %, minutes after oxygen was discontinued.  Philomena Dohenyavid Donelle Baba RN

## 2014-08-24 NOTE — Progress Notes (Signed)
Pt currently on Auto CPAP 6-20 CMH20 with 4 LPM O2 bleed in via FFM.  Pt tolerating well at this time, RT to monitor and assess as needed.

## 2014-08-24 NOTE — Progress Notes (Signed)
  Echocardiogram 2D Echocardiogram has been performed.  Arvil ChacoFoster, Delberta Folts 08/24/2014, 1:35 PM

## 2014-08-25 DIAGNOSIS — L270 Generalized skin eruption due to drugs and medicaments taken internally: Secondary | ICD-10-CM

## 2014-08-25 DIAGNOSIS — A414 Sepsis due to anaerobes: Secondary | ICD-10-CM

## 2014-08-25 DIAGNOSIS — R918 Other nonspecific abnormal finding of lung field: Secondary | ICD-10-CM

## 2014-08-25 LAB — BASIC METABOLIC PANEL
Anion gap: 17 — ABNORMAL HIGH (ref 5–15)
BUN: 35 mg/dL — AB (ref 6–23)
CHLORIDE: 92 meq/L — AB (ref 96–112)
CO2: 21 meq/L (ref 19–32)
Calcium: 8.8 mg/dL (ref 8.4–10.5)
Creatinine, Ser: 1.45 mg/dL — ABNORMAL HIGH (ref 0.50–1.35)
GFR calc Af Amer: 55 mL/min — ABNORMAL LOW (ref >90)
GFR calc non Af Amer: 48 mL/min — ABNORMAL LOW (ref >90)
Glucose, Bld: 192 mg/dL — ABNORMAL HIGH (ref 70–99)
POTASSIUM: 3.8 meq/L (ref 3.7–5.3)
Sodium: 130 mEq/L — ABNORMAL LOW (ref 137–147)

## 2014-08-25 LAB — CBC
HEMATOCRIT: 40.1 % (ref 39.0–52.0)
HEMOGLOBIN: 13.8 g/dL (ref 13.0–17.0)
MCH: 29.6 pg (ref 26.0–34.0)
MCHC: 34.4 g/dL (ref 30.0–36.0)
MCV: 86.1 fL (ref 78.0–100.0)
Platelets: 105 10*3/uL — ABNORMAL LOW (ref 150–400)
RBC: 4.66 MIL/uL (ref 4.22–5.81)
RDW: 13.6 % (ref 11.5–15.5)
WBC: 12.4 10*3/uL — ABNORMAL HIGH (ref 4.0–10.5)

## 2014-08-25 LAB — GLUCOSE, CAPILLARY
GLUCOSE-CAPILLARY: 318 mg/dL — AB (ref 70–99)
GLUCOSE-CAPILLARY: 142 mg/dL — AB (ref 70–99)
Glucose-Capillary: 229 mg/dL — ABNORMAL HIGH (ref 70–99)

## 2014-08-25 MED ORDER — HYDROCORTISONE 1 % EX CREA: TOPICAL_CREAM | Freq: Three times a day (TID) | CUTANEOUS | Status: DC | PRN

## 2014-08-25 MED ORDER — GLYCERIN (LAXATIVE) 2.1 G RE SUPP
1.0000 | Freq: Once | RECTAL | Status: AC
  Administered 2014-08-24: 1 via RECTAL
  Filled 2014-08-25: qty 1

## 2014-08-25 MED ORDER — SACCHAROMYCES BOULARDII 250 MG PO CAPS: 250.0000 mg | ORAL_CAPSULE | Freq: Two times a day (BID) | ORAL | Status: DC

## 2014-08-25 MED ORDER — CIPROFLOXACIN HCL 500 MG PO TABS: 500.0000 mg | ORAL_TABLET | Freq: Two times a day (BID) | ORAL | Status: DC

## 2014-08-25 MED ORDER — FUROSEMIDE 10 MG/ML IJ SOLN: 40.0000 mg | Freq: Once | INTRAMUSCULAR | Status: DC

## 2014-08-25 MED ORDER — DSS 100 MG PO CAPS: 100.0000 mg | ORAL_CAPSULE | Freq: Two times a day (BID) | ORAL | Status: DC

## 2014-08-25 MED ORDER — HYDROCORTISONE 1 % EX CREA
TOPICAL_CREAM | Freq: Three times a day (TID) | CUTANEOUS | Status: DC | PRN
  Filled 2014-08-25: qty 28

## 2014-08-25 MED ORDER — MIRABEGRON ER 25 MG PO TB24: 25.0000 mg | ORAL_TABLET | Freq: Every day | ORAL | Status: DC

## 2014-08-25 MED ORDER — SENNA 8.6 MG PO TABS: 2.0000 | ORAL_TABLET | Freq: Every day | ORAL | Status: DC

## 2014-08-25 MED ORDER — POLYETHYLENE GLYCOL 3350 17 G PO PACK: 17.0000 g | PACK | Freq: Every day | ORAL | Status: DC | PRN

## 2014-08-25 NOTE — Progress Notes (Signed)
Physical Therapy Treatment Patient Details Name: Cody [name] MRN: [id number] DOB: [birth year] Today's Date: 08/25/2014    History of Present Illness 68 year old male with past medical history of diabetes, hypertension, Parkinson's disease, obesity who [facility] ED 08/20/2014 with worsening pain in right flank area for past few days prior to this admission and worsening over past 24 hours. Pt s/p cystoscopy and Right ureteral stent placement.    PT Comments    *Pt ambulated in hall with RW today and had several episodes of loss of balance requiring mod assist to prevent fall. He is at high fall risk and needs assist when mobilizing. Pt will not have 24* assist at home until 08/28/14, so ST-SNF recommended. He is highly motivated to progress with mobility and puts forth good effort with PT. At baseline he walks without an assistive device, his goal is to return to independence with mobility. Good progress expected, he is deconditioned from decreased activity during hospitalization.  **  Follow Up Recommendations  SNF;Supervision for mobility/OOB     Equipment Recommendations  None recommended by PT    Recommendations for Other Services       Precautions / Restrictions Precautions Precautions: Fall Precaution Comments: pt has had several falls in past year Restrictions Weight Bearing Restrictions: No    Mobility  Bed Mobility               General bed mobility comments: up in recliner upon arrival  Transfers Overall transfer level: Needs assistance Equipment used: Rolling walker (2 wheeled) Transfers: Sit to/from Stand Sit to Stand: Min guard         General transfer comment: min guard assist from recliner, initially posterior lean but able to come to neutral  Ambulation/Gait Ambulation/Gait assistance: Mod assist;Min assist Ambulation Distance (Feet): 200 Feet Distance limited by fatigue/SOB.  Assistive device: Rolling walker (2 wheeled) Gait  Pattern/deviations: Decreased step length - right;Decreased step length - left;Step-through pattern   Gait velocity interpretation: Below normal speed for age/gender General Gait Details: Pt with shuffeling gait pattern with RW.  Min to mod assist for loss of balance x 3, especially with turns, when distracted,  and with increased velocity. Verbal cues to increase step length and decrease velocity.  At baseline pt walks without assistive device. Pt walked today with hand held assist of 1 for 25' and required mod assist for loss of balance x 2.    Stairs            Wheelchair Mobility    Modified Rankin (Stroke Patients Only)       Balance                             High level balance activites: Side stepping;Backward walking High Level Balance Comments: side stepping 15' x 4 trials, backwards walking 15' x 2; min to mod assist required for balance    Cognition Arousal/Alertness: Awake/alert Behavior During Therapy: WFL for tasks assessed/performed Overall Cognitive Status: Within Functional Limits for tasks assessed                      Exercises General Exercises - Lower Extremity Ankle Circles/Pumps: AROM;Both;15 reps;Seated Long Arc Quad: AROM;Both;15 reps;Seated Hip Flexion/Marching: AROM;Both;10 reps;Seated    General Comments        Pertinent Vitals/Pain Pain Assessment: No/denies pain    Home Living  Prior Function            PT Goals (current goals can now be found in the care plan section) Acute Rehab PT Goals Patient Stated Goal: Return home, be independent PT Goal Formulation: With patient/family (pt and girlfriend) Time For Goal Achievement: 09/07/14 Potential to Achieve Goals: Good Progress towards PT goals: Progressing toward goals    Frequency  Min 3X/week    PT Plan Discharge plan needs to be updated (pt will not have assistance at home until 08/28/14, he needs supervision/assist for  mobility due to high fall risk, so now recommendeing ST-SNF. )    Co-evaluation             End of Session Equipment Utilized During Treatment: Gait belt Activity Tolerance: Patient tolerated treatment well Patient left: in chair;with family/visitor present;with call bell/phone within reach     Time: 0916-0955 PT Time Calculation (min): 39 min  Charges:  $Gait Training: 8-22 mins $Therapeutic Exercise: 8-22 mins $Therapeutic Activity: 8-22 mins                    G Codes:      Tamala SerUhlenberg, Frederico Gerling Kistler 08/25/2014, 10:07 AM [phone]

## 2014-08-25 NOTE — Progress Notes (Signed)
Clinical Social Work Department BRIEF PSYCHOSOCIAL ASSESSMENT 08/25/2014  Patient:  Cody [name], Cody [name]     Account Number:  [id number]     Admit date:  08/20/2014  Clinical Social Worker:  Earlie Server  Date/Time:  08/25/2014 10:30 AM  Referred by:  Physician  Date Referred:  08/25/2014 Referred for  SNF Placement   Other Referral:   Interview type:  Patient Other interview type:    PSYCHOSOCIAL DATA Living Status:  FAMILY Admitted from facility:   Level of care:   Primary support name:  Dawn Primary support relationship to patient:  CHILD, ADULT Degree of support available:   Adequate    CURRENT CONCERNS Current Concerns  Post-Acute Placement   Other Concerns:    SOCIAL WORK ASSESSMENT / PLAN CSW received referral in order to assist with SNF placement. CSW reviewed chart and met with patient and girlfriend at bedside. CSW introduced myself and explained role.    Patient reports he lives at home and girlfriend was going to provide support but she has to go out of town now. Patient was interested in [name] but CSW explained that [name] was no in-network with his insurance. CSW provided SNF list and patient reports he would go to [facility]. CSW spoke with [facility] who is agreeable to review information and alert CSW if they can accept patient.    While awaiting a return call from [facility], patient and girlfriend called CSW and reported that patient has found someone to stay with him while girlfriend is out of town and he wants to go home with [facility]. CM aware of [place] needs. CSW informed MD of change of plans.    CSW is signing off but available if needed.   Assessment/plan status:  Psychosocial Support/Ongoing Assessment of Needs Other assessment/ plan:   Information/referral to community resources:   SNF list    PATIENT'S/FAMILY'S RESPONSE TO PLAN OF CARE: Patient alert and oriented and engaged in assessment. Patient agreeable for girlfriend to be  involved in assessment. Patient reports he was doing well and interested in options. Patient reports after reviewing information that he is interested in returning home. Patient feels safe with returning home and with support of friends and family.       [place], [place] [phone]

## 2014-08-25 NOTE — Discharge Summary (Addendum)
Physician Discharge Summary  Cody [name] ZOX:[id number] DOB: June 13, 1946 DOA: 08/20/2014  PCP: [name], MD  Admit date: 08/20/2014 Discharge date: 08/25/2014  Recommendations for Outpatient Follow-up:  1. HH PT/OT  2. Dr. Vernie Ammons in 2 weeks  3. Repeat CBC and BMP in 1-2 weeks to follow up leukocytosis, thrombocytopenia, and AKI 4. F/u blood pressure and resume medications in 2 weeks if indicated 5. CT chest as outpatient to follow up pulmonary nodules 6. Continue ciprofloxacin through 11/16 7. Florastor for 1 month, then d/c.  Discharge Diagnoses:  Principal Problem:   Klebsiella sepsis Active Problems:   Essential hypertension   Parkinson's disease   Sepsis   Obstruction of right ureteropelvic junction (UPJ) due to stone   Leukocytosis   Acute renal failure   Thrombocytopenia   Bacteremia   Constipation   Drug rash   Pulmonary nodules/lesions, multiple   Discharge Condition: stable, improved   Diet recommendation: diabetic diet  Wt Readings from Last 3 Encounters:  08/21/14 108.6 kg (239 lb 6.7 oz)  01/05/14 98.657 kg (217 lb 8 oz)  01/22/13 98.884 kg (218 lb)    History of present illness:   68 year old male with past medical history of diabetes, hypertension, Parkinson's disease, obesity who presented to [facility] ED 08/20/2014 with worsening pain in right flank area for past few days prior to this admission and worsening over past 24 hours. He had associated nausea, nonbloody vomiting, and fever.  In ED, blood pressure was 87/69, heart rate 107, RR 26, T max 102.7 F and oxygen saturation 94% on room air. Blood work revealed leukocytosis of 12.9, creatinine of 1.63. CT abdomen without contrast showed large stone in right UPJ causing right-sided hydronephrosis, smaller nonobstructing calculus noted within the inferior pole collecting system of the right kidney. Multiple other indeterminate nodules were identified within the lung bases and the recommendation is to do  nonemergent CT of the chest for further investigation. Urology was consulted.  He was given Rocephin in the ED.   Hospital Course:   Klebsiella oxytoca septicemia secondary to pyelonephritis, present at the time of admission  - Initial blood and urine cultures both grew Klebsiella -  He was initially started on ceftriaxone, however, this was changed to imipenem due to the severity of his infection.   -  Klebsiella sensitive to ciprofloxacin so antibiotic transitioned on 11/4 - Surveillance blood cultures 08/23/2014 NGTD -  Recommend a 14-day course of treatment starting on 11/3  Nephrolithiasis/obstructive uropathy -Appreciate Dr. Vernie Ammons -08/22/14--cystoscopy and ureteral stent -continue abx pending culture data -08/20/2014 CT abdomen and pelvis--R-UPJ stone - f/u with Dr. Vernie Ammons   AKI, baseline creatinine 0.9-1.1,  - Likely multifactorial including volume depletion in the setting of ACEi/HCTZ, NSAIDs/toradol (in ED), sepsis, and obstructive uropathy -patient also took 9 tablets of ibuprofen on the day he came to hospital -continue to hold NSAIDs, ACEi, HCTZ - he was initially hydrated, but he has tolerated several days of diuresis with continued improvement in renal function  Acute hypoxic respiratory failure secondary to pulmonary edema, persistent rales and swelling on exam. This may be secondary to fluid retention in setting of AKI and less likely acute on chronic diastolic heart failure -08/23/14--repeat CXR--b/l pulm opacities, pulm edema - diuresed well with lasix 40mg  IV once daily -echo: Ejection fraction of 55-60% with normal systolic function, hypokinesis of the inferoseptal myocardium, grade 1 diastolic dysfunction - wt at discharge 108.6kg  Diabetes mellitus type 2, CBG near goal.   -Held oral medications and given SSI while  inpateint -Hemoglobin A1c--7.5 -may resume home medications  Hypertension, continue to hold atenolol, lisinopril, HCTZ -  Repeat blood  pressure and BMP in two weeks and resume medications if indicated at that time  Parkinson's disease -continued Stalevo  Depression -Continued Zoloft  Pulmonary nodules -Incidental finding on CT of the abdomen and pelvis -Please perform CT chest as outpatient   Thrombocytopenia -Patient appears to have mild thrombocytopenia on previous labs -INR, PTT, fibrinogen were not suggestive of DIC - improved spontaneously  Drug rash may have been due to ceftriaxone or imipenem.  There is cross-reactivity with PCN with both medications and timing of onset of rash was unclear.   -  Ceftriaxone and imipenem both added to allergy list for now - Hydrocortisone cream prn  Constipation - resolved with colace, senna, miralax  Consultants:  Urology, Dr. Vernie Ammonsttelin  Procedures: 08/22/14--cystoscopy and ureteral stent by Dr. Vernie Ammonsttelin  Antibiotics:  Ceftriaxone 08/20/2014>>>08/22/14  Imipenem 08/22/14>>> 11/4  Ciprofloxacin 11/4 >>   Discharge Exam: Filed Vitals:   08/25/14 1057  BP: 136/73  Pulse: 81  Temp: 97.8 F (36.6 C)  Resp: 24   Filed Vitals:   08/21/14 0514 08/25/14 0540 08/25/14 0801 08/25/14 1057  BP:  129/77 121/71 136/73  Pulse:  88 89 81  Temp:   97.7 F (36.5 C) 97.8 F (36.6 C)  TempSrc:  Oral Oral Oral  Resp:  22 20 24   Height: 5\' 11"  (1.803 m)     Weight: 108.6 kg (239 lb 6.7 oz)     SpO2:  97% 92% 97%     General: WM, No acute distress  HEENT: NCAT, MMM  Cardiovascular: RRR, nl S1, S2 no mrg, 2+ pulses, warm extremities  Respiratory: CTAB, no increased WOB  Abdomen: NABS, soft, NT/ND  MSK: Normal tone and bulk, 1+ bilateral LEE  Neuro: Grossly intact   Discharge Instructions      Discharge Instructions    Call MD for:  difficulty breathing, headache or visual disturbances    Complete by:  As directed      Call MD for:  extreme fatigue    Complete by:  As directed      Call MD for:  hives    Complete by:  As directed      Call  MD for:  persistant dizziness or light-headedness    Complete by:  As directed      Call MD for:  persistant nausea and vomiting    Complete by:  As directed      Call MD for:  severe uncontrolled pain    Complete by:  As directed      Call MD for:  temperature >100.4    Complete by:  As directed      Diet Carb Modified    Complete by:  As directed      Discharge instructions    Complete by:  As directed   You were hospitalized with severe infection due to urinary tract infection and a kidney stone.  Please take ciprofloxacin twice a day, your next dose is due this evening, until all the tabs are gone.  If you have worsening pain, fevers, chills, nausea, vomiting, or other signs of worsening illness, please return to the hospital.  Please follow up with urology in about 2 weeks.     Increase activity slowly    Complete by:  As directed             Medication List    STOP taking  these medications        atenolol 25 MG tablet  Commonly known as:  TENORMIN     lisinopril-hydrochlorothiazide 10-12.5 MG per tablet  Commonly known as:  PRINZIDE,ZESTORETIC      TAKE these medications        carbidopa-levodopa-entacapone 50-200-200 MG per tablet  Commonly known as:  STALEVO  Take 1 tablet by mouth 4 (four) times daily. At 6am, 10am, 2pm, and 6pm     ciprofloxacin 500 MG tablet  Commonly known as:  CIPRO  Take 1 tablet (500 mg total) by mouth 2 (two) times daily.     DSS 100 MG Caps  Take 100 mg by mouth 2 (two) times daily.     glipiZIDE 5 MG tablet  Commonly known as:  GLUCOTROL  Take 5 mg by mouth 2 (two) times daily before a meal.     hydrocortisone cream 1 %  Apply topically 3 (three) times daily as needed for itching.     metFORMIN 500 MG tablet  Commonly known as:  GLUCOPHAGE  Take 1,000 mg by mouth 2 (two) times daily with a meal.     mirabegron ER 25 MG Tb24 tablet  Commonly known as:  MYRBETRIQ  Take 1 tablet (25 mg total) by mouth daily.     polyethylene  glycol packet  Commonly known as:  MIRALAX / GLYCOLAX  Take 17 g by mouth daily as needed for mild constipation.     saccharomyces boulardii 250 MG capsule  Commonly known as:  FLORASTOR  Take 1 capsule (250 mg total) by mouth 2 (two) times daily.     senna 8.6 MG Tabs tablet  Commonly known as:  SENOKOT  Take 2 tablets (17.2 mg total) by mouth at bedtime.     sertraline 100 MG tablet  Commonly known as:  ZOLOFT  Take 100 mg by mouth daily.     Vitamin D3 3000 UNITS Tabs  Take 1 tablet by mouth daily.       Follow-up Information    Follow up with Garnett Farm, MD.   Specialty:  Urology   Why:  Contact my office to schedule an appointment about 1 week after your discharge so we can schedule treatment of your stone.   Contact information:   [address] [place] [address] [phone]       Follow up with [name], MD In 2 weeks.   Specialty:  Family Medicine   Contact information:   [address] [place] [address] [phone]        The results of significant diagnostics from this hospitalization (including imaging, microbiology, ancillary and laboratory) are listed below for reference.    Significant Diagnostic Studies: Ct Abdomen Pelvis Wo Contrast  08/20/2014   CLINICAL DATA:  Right flank pain  EXAM: CT ABDOMEN AND PELVIS WITHOUT CONTRAST  TECHNIQUE: Multidetector CT imaging of the abdomen and pelvis was performed following the standard protocol without IV contrast.  COMPARISON:  None.  FINDINGS: Lower chest: There are multiple small nodules identified in both lungs. Index nodule in the right middle lobe measures 5 mm, image 9 of series 203. Lingular nodule measures 4 mm, image 13/ series 203. No pleural effusion noted  Hepatobiliary: Diffuse fatty infiltration of the liver. The gallbladder is normal. There is no biliary dilatation. Normal appearance of the pancreas.  Pancreas: Normal appearance of the pancreas.  Spleen: The spleen  is on unremarkable.  Adrenals/Urinary Tract: The adrenal glands are  both normal. 3 mm nonobstructing calculus is identified within the upper pole the left kidney. Low-attenuation structure in the left kidney measures 1.8 cm and is incompletely characterized without IV contrast. There is a 5 mm stone within the lower pole of the right kidney. At the right UPJ there is a 1.2 by 1.1 cm stone. This causes mild right hydronephrosis. The urinary bladder appears normal.  Stomach/Bowel: The stomach is normal. The small bowel loops have a normal course and caliber. No obstruction. Normal appearance of the colon.  Vascular/Lymphatic: Calcified atherosclerotic disease involves the abdominal aorta. There is no aneurysm. There is no retroperitoneal adenopathy identified no pelvic or inguinal adenopathy noted.  Reproductive: Prostate gland and seminal vesicles appear normal.  Other: There is no free fluid or fluid collections identified within the abdomen or pelvis.  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 There are bilateral L5 pars defects identified. Anterolisthesis of L5 on S1 appears normal.  IMPRESSION: 1. Large stone is identified at the right UPJ. This causes right-sided hydronephrosis. Smaller nonobstructing calculus is noted within the inferior pole collecting system of the right kidney. 2. Advanced lumbar spondylosis. 3. There are multiple indeterminate nodules identified within the lung bases. Consider followup common nonemergent CT of the chest for further investigation.   Electronically Signed   By: Signa Kell M.D.   On: 08/20/2014 22:02   Dg Chest 2 View  08/23/2014   CLINICAL DATA:  Shortness of breath and cough. Worsening for 1 week.  EXAM: CHEST  2 VIEW  COMPARISON:  Chest radiograph 08/20/2014  FINDINGS: Low lung volumes. Stable cardiac and mediastinal contours. Elevation right  hemidiaphragm. Interval development of diffuse bilateral heterogeneous pulmonary opacities. Small bilateral pleural effusions. Mid thoracic spine degenerative change.  IMPRESSION: Diffuse bilateral pulmonary opacities may represent edema with associated small pleural effusions. Multi focal infection is not excluded.   Electronically Signed   By: Annia Belt M.D.   On: 08/23/2014 17:36   Dg Chest 2 View  08/21/2014   CLINICAL DATA:  Right-sided flank pain with fever.  Kidney stone.  EXAM: CHEST  2 VIEW  COMPARISON:  01/02/2014  FINDINGS: Normal heart size. No pleural effusion identified. There is no airspace consolidation. Asymmetric elevation of the right hemidiaphragm is identified. No pleural effusion or edema identified. There is no airspace consolidation. Spondylosis identified within the thoracic spine.  IMPRESSION: 1. No acute findings. 2. Asymmetric elevation of right hemidiaphragm.   Electronically Signed   By: Signa Kell M.D.   On: 08/21/2014 00:06    Microbiology: Recent Results (from the past 240 hour(s))  Urine culture     Status: None   Collection Time: 08/20/14  8:09 PM  Result Value Ref Range Status   Specimen Description URINE, RANDOM  Final   Special Requests ADDED 2110  Final   Culture  Setup Time   Final    08/21/2014 14:28 Performed at [facility]    Colony Count   Final    >=100,000 COLONIES/ML Performed at [facility]    Culture   Final    KLEBSIELLA OXYTOCA Performed at [facility]    Report Status 08/23/2014 FINAL  Final   Organism ID, Bacteria KLEBSIELLA OXYTOCA  Final      Susceptibility   Klebsiella oxytoca - MIC*    AMPICILLIN >=32 RESISTANT Resistant     CEFAZOLIN 32 RESISTANT Resistant  CEFTRIAXONE <=1 SENSITIVE Sensitive     CIPROFLOXACIN <=0.25 SENSITIVE Sensitive     GENTAMICIN <=1 SENSITIVE Sensitive     LEVOFLOXACIN <=0.12 SENSITIVE Sensitive     NITROFURANTOIN <=16 SENSITIVE Sensitive     TOBRAMYCIN <=1  SENSITIVE Sensitive     TRIMETH/SULFA <=20 SENSITIVE Sensitive     PIP/TAZO <=4 SENSITIVE Sensitive     * KLEBSIELLA OXYTOCA  Culture, blood (routine x 2)     Status: None   Collection Time: 08/20/14 10:25 PM  Result Value Ref Range Status   Specimen Description BLOOD LEFT ARM  Final   Special Requests BOTTLES DRAWN AEROBIC AND ANAEROBIC 5CC EA  Final   Culture  Setup Time   Final    08/21/2014 04:11 [facility]formed at [facility]    Culture   Final    KLEBSIELLA OXYTOCA Note: CRITICAL RESULT CALLED TO, READ BACK BY AND VERIFIED WITH: VERA JACKSON RN @ 1040PM VINCJ [id number] SUSCEPTIBILITIES PERFORMED ON PREVIOUS CULTURE WITHIN THE LAST 5 DAYS. Performed at [facility]    Report Status 08/24/2014 FINAL  Final  Culture, blood (routine x 2)     Status: None   Collection Time: 08/20/14 10:31 PM  Result Value Ref Range Status   Specimen Description BLOOD LEFT FOREARM  Final   Special Requests BOTTLES DRAWN AEROBIC AND ANAEROBIC [facility]5CC EACH  Final   Culture  Setup Time   Final    08/21/2014 04:11 Performed at [facility]    Culture   Final    KLEBSIELLA OXYTOCA Note: Gram Stain Report Called to,Read Back By and Verified With: VERA JACKSON @ 3:53AM 11.2.15 BY DESIS Performed at [facility]    Report Status 08/24/2014 FINAL  Final   Organism ID, Bacteria KLEBSIELLA OXYTOCA  Final      Susceptibility   Klebsiella oxytoca - MIC*    AMPICILLIN >=32 RESISTANT Resistant     AMPICILLIN/SULBACTAM 16 INTERMEDIATE Intermediate     CEFAZOLIN >=64 RESISTANT Resistant     CEFEPIME <=1 SENSITIVE Sensitive     CEFTAZIDIME <=1 SENSITIVE Sensitive     CEFTRIAXONE <=1 SENSITIVE Sensitive     CIPROFLOXACIN <=0.25 SENSITIVE Sensitive     GENTAMICIN <=1 SENSITIVE Sensitive     IMIPENEM 0.5 SENSITIVE Sensitive     PIP/TAZO <=4 SENSITIVE Sensitive     TOBRAMYCIN <=1 SENSITIVE Sensitive     TRIMETH/SULFA <=20 SENSITIVE Sensitive     * KLEBSIELLA OXYTOCA  Urine culture      Status: None   Collection Time: 08/21/14 12:51 PM  Result Value Ref Range Status   Specimen Description URINE, RANDOM  Final   Special Requests NONE  Final   Culture  Setup Time   Final    08/21/2014 23:29 Performed at [facility]    Colony Count NO GROWTH [facility] at [facility]   Final   Culture NO GROWTH [facility] at [facility]   Final   Report Status 08/23/2014 FINAL  Final  MRSA PCR Screening     Status: None   Collection Time: 08/22/14 10:14 AM  Result Value Ref Range Status   MRSA by PCR NEGATIVE NEGATIVE Final    Comment:        The GeneXpert MRSA Assay (FDA approved for NASAL specimens only), is one component of a comprehensive MRSA colonization surveillance program. It is not intended to diagnose MRSA infection nor to guide or monitor treatment for MRSA infections.   Culture, expectorated sputum-assessment  Status: None   Collection Time: 08/22/14  7:05 PM  Result Value Ref Range Status   Specimen Description SPUTUM  Final   Special Requests Normal  Final   Sputum evaluation   Final    THIS SPECIMEN IS ACCEPTABLE. RESPIRATORY CULTURE REPORT TO FOLLOW.   Report Status 08/22/2014 FINAL  Final  Culture, respiratory (NON-Expectorated)     Status: None   Collection Time: 08/22/14  7:05 PM  Result Value Ref Range Status   Specimen Description SPUTUM  Final   Special Requests NONE  Final   Gram Stain   Final    ABUNDANT WBC PRESENT, PREDOMINANTLY PMN NO SQUAMOUS EPITHELIAL CELLS SEEN NO ORGANISMS SEEN Performed at [facility]    Culture   Final    NORMAL OROPHARYNGEAL FLORA Performed at [facility]    Report Status 08/24/2014 FINAL  Final  Culture, blood (routine x 2)     Status: None (Preliminary result)   Collection Time: 08/23/14  6:25 AM  Result Value Ref Range Status   Specimen Description BLOOD RIGHT ARM  Final   Special Requests BOTTLES DRAWN AEROBIC AND ANAEROBIC 10CC  Final   Culture  Setup Time    Final    08/23/2014 10:01 Performed at [facility]    Culture   Final           BLOOD CULTURE RECEIVED NO GROWTH TO DATE CULTURE WILL BE HELD FOR 5 DAYS BEFORE ISSUING A FINAL NEGATIVE REPORT Performed at [facility]    Report Status PENDING  Incomplete  Culture, blood (routine x 2)     Status: None (Preliminary result)   Collection Time: 08/23/14  6:29 AM  Result Value Ref Range Status   Specimen Description BLOOD RIGHT HAND  Final   Special Requests BOTTLES DRAWN AEROBIC AND ANAEROBIC 5CC  Final   Culture  Setup Time   Final    08/23/2014 10:01 Performed at [facility]    Culture   Final           BLOOD CULTURE RECEIVED NO GROWTH TO DATE CULTURE WILL BE HELD FOR 5 DAYS BEFORE ISSUING A FINAL NEGATIVE REPORT Performed at [facility]    Report Status PENDING  Incomplete     Labs: Basic Metabolic Panel:  Recent Labs Lab 08/21/14 0723 08/22/14 0506 08/23/14 0625 08/24/14 0503 08/25/14 0540  NA 136*  140 131* 130* 131* 130*  K 3.9  4.0 4.0 4.3 3.9 3.8  CL 99  102 95* 95* 93* 92*  CO2 18*  19 20 18* 22 21  GLUCOSE 167*  166* 113* 160* 187* 192*  BUN 35*  35* 44* 38* 32* 35*  CREATININE 2.57*  2.59* 2.52* 1.71* 1.53* 1.45*  CALCIUM 8.3*  8.1* 7.7* 8.0* 8.6 8.8   Liver Function Tests:  Recent Labs Lab 08/20/14 2003 08/21/14 0723  AST 27 27  ALT 6 20  ALKPHOS 68 65  BILITOT 1.1 1.0  PROT 7.5 6.0  ALBUMIN 4.1 3.3*    Recent Labs Lab 08/20/14 2003  LIPASE 22   No results for input(s): AMMONIA in the last 168 hours. CBC:  Recent Labs Lab 08/20/14 2003 08/21/14 0723 08/22/14 0506 08/23/14 0625 08/24/14 0503 08/25/14 0540  WBC 12.9* 9.2 14.0* 13.3* 13.6* 12.4*  NEUTROABS 11.1*  --   --   --   --   --   HGB 14.3 13.6 13.1 13.2 14.5 13.8  HCT 42.5 40.9 38.7* 38.7* 42.1 40.1  MCV 88.2 89.9 88.4 87.8 87.3 86.1  PLT 184 120* 114* 92* 93* 105*   Cardiac Enzymes: No results for input(s): CKTOTAL, CKMB,  CKMBINDEX, TROPONINI in the last 168 hours. BNP: BNP (last 3 results) No results for input(s): PROBNP in the last 8760 hours. CBG:  Recent Labs Lab 08/23/14 2138 08/24/14 0724 08/24/14 1204 08/24/14 2149 08/25/14 0808  GLUCAP 188* 183* 155* 229* 318*    Time coordinating discharge: 35 minutes  Signed:  Kevina Piloto  [facility] 08/25/2014, 11:04 AM

## 2014-08-25 NOTE — Progress Notes (Signed)
Patient ID: Cody [name], male   DOB: 26-Jun-1946, 68 y.o.   MRN: [id number]  Subjective: The patient reports no flank pain or voiding symptoms.  He has developed a rash on his back that itches mildly.  Objective: Vital signs in last 24 hours: Temp:  [97.3 F (36.3 C)-100.2 F (37.9 C)] 97.7 F (36.5 C) (11/05 0801) Pulse Rate:  [86-94] 89 (11/05 0801) Resp:  [20-22] 20 (11/05 0801) BP: (121-138)/(71-78) 121/71 mmHg (11/05 0801) SpO2:  [92 %-98 %] 92 % (11/05 0801)A  Intake/Output from previous day: 11/04 0701 - 11/05 0700 In: 720 [P.O.:720] Out: -  Intake/Output this shift:    Past Medical History  Diagnosis Date  . Back pain     lumbar  . Urgency of urination   . Hammer toe   . Diabetes mellitus   . Unspecified essential hypertension   . Parkinson disease     Physical Exam:  Lungs - Normal respiratory effort, chest expands symmetrically.  Abdomen - Soft, non-tender & non-distended.  Lab Results:  Recent Labs  08/23/14 0625 08/24/14 0503 08/25/14 0540  WBC 13.3* 13.6* 12.4*  HGB 13.2 14.5 13.8  HCT 38.7* 42.1 40.1   BMET  Recent Labs  08/24/14 0503 08/25/14 0540  NA 131* 130*  K 3.9 3.8  CL 93* 92*  CO2 22 21  GLUCOSE 187* 192*  BUN 32* 35*  CREATININE 1.53* 1.45*  CALCIUM 8.6 8.8   No results for input(s): LABURIN in the last 72 hours. Results for orders placed or performed during the hospital encounter of 08/20/14  Urine culture     Status: None   Collection Time: 08/20/14  8:09 PM  Result Value Ref Range Status   Specimen Description URINE, RANDOM  Final   Special Requests ADDED 2110  Final   Culture  Setup Time   Final    08/21/2014 14:28 Performed at [facility]    Colony Count   Final    >=100,000 COLONIES/ML Performed at [facility]    Culture   Final    KLEBSIELLA OXYTOCA Performed at [facility]    Report Status 08/23/2014 FINAL  Final   Organism ID, Bacteria KLEBSIELLA OXYTOCA  Final   Susceptibility   Klebsiella oxytoca - MIC*    AMPICILLIN >=32 RESISTANT Resistant     CEFAZOLIN 32 RESISTANT Resistant     CEFTRIAXONE <=1 SENSITIVE Sensitive     CIPROFLOXACIN <=0.25 SENSITIVE Sensitive     GENTAMICIN <=1 SENSITIVE Sensitive     LEVOFLOXACIN <=0.12 SENSITIVE Sensitive     NITROFURANTOIN <=16 SENSITIVE Sensitive     TOBRAMYCIN <=1 SENSITIVE Sensitive     TRIMETH/SULFA <=20 SENSITIVE Sensitive     PIP/TAZO <=4 SENSITIVE Sensitive     * KLEBSIELLA OXYTOCA  Culture, blood (routine x 2)     Status: None   Collection Time: 08/20/14 10:25 PM  Result Value Ref Range Status   Specimen Description BLOOD LEFT ARM  Final   Special Requests BOTTLES DRAWN AEROBIC AND ANAEROBIC 5CC EA  Final   Culture  Setup Time   Final    08/21/2014 04:11 Performed at [facility]    Culture   Final    KLEBSIELLA OXYTOCA Note: CRITICAL RESULT CALLED TO, READ BACK BY AND VERIFIED WITH: VERA JACKSON RN @ 1040PM VINCJ [id number] SUSCEPTIBILITIES PERFORMED ON PREVIOUS CULTURE WITHIN THE LAST 5 DAYS. Performed at [facility]    Report Status 08/24/2014 FINAL  Final  Culture, blood (routine x  2)     Status: None   Collection Time: 08/20/14 10:31 PM  Result Value Ref Range Status   Specimen Description BLOOD LEFT FOREARM  Final   Special Requests BOTTLES DRAWN AEROBIC AND ANAEROBIC [facility] EACH  Final   Culture  Setup Time   Final    08/21/2014 04:11 Performed at [facility]    Culture   Final    KLEBSIELLA OXYTOCA Note: Gram Stain Report Called to,Read Back By and Verified With: VERA JACKSON @ 3:53AM 11.2.15 BY DESIS Performed at [facility]    Report Status 08/24/2014 FINAL  Final   Organism ID, Bacteria KLEBSIELLA OXYTOCA  Final      Susceptibility   Klebsiella oxytoca - MIC*    AMPICILLIN >=32 RESISTANT Resistant     AMPICILLIN/SULBACTAM 16 INTERMEDIATE Intermediate     CEFAZOLIN >=64 RESISTANT Resistant     CEFEPIME <=1 SENSITIVE Sensitive      CEFTAZIDIME <=1 SENSITIVE Sensitive     CEFTRIAXONE <=1 SENSITIVE Sensitive     CIPROFLOXACIN <=0.25 SENSITIVE Sensitive     GENTAMICIN <=1 SENSITIVE Sensitive     IMIPENEM 0.5 SENSITIVE Sensitive     PIP/TAZO <=4 SENSITIVE Sensitive     TOBRAMYCIN <=1 SENSITIVE Sensitive     TRIMETH/SULFA <=20 SENSITIVE Sensitive     * KLEBSIELLA OXYTOCA  Urine culture     Status: None   Collection Time: 08/21/14 12:51 PM  Result Value Ref Range Status   Specimen Description URINE, RANDOM  Final   Special Requests NONE  Final   Culture  Setup Time   Final    08/21/2014 23:29 Performed at [facility]    Colony Count NO GROWTH Performed at [facility]   Final   Culture NO GROWTH Performed at [facility]   Final   Report Status 08/23/2014 FINAL  Final  MRSA PCR Screening     Status: None   Collection Time: 08/22/14 10:14 AM  Result Value Ref Range Status   MRSA by PCR NEGATIVE NEGATIVE Final    Comment:        The GeneXpert MRSA Assay (FDA approved for NASAL specimens only), is one component of a comprehensive MRSA colonization surveillance program. It is not intended to diagnose MRSA infection nor to guide or monitor treatment for MRSA infections.   Culture, expectorated sputum-assessment     Status: None   Collection Time: 08/22/14  7:05 PM  Result Value Ref Range Status   Specimen Description SPUTUM  Final   Special Requests Normal  Final   Sputum evaluation   Final    THIS SPECIMEN IS ACCEPTABLE. RESPIRATORY CULTURE REPORT TO FOLLOW.   Report Status 08/22/2014 FINAL  Final  Culture, respiratory (NON-Expectorated)     Status: None   Collection Time: 08/22/14  7:05 PM  Result Value Ref Range Status   Specimen Description SPUTUM  Final   Special Requests NONE  Final   Gram Stain   Final    ABUNDANT WBC PRESENT, PREDOMINANTLY PMN NO SQUAMOUS EPITHELIAL CELLS SEEN NO ORGANISMS SEEN Performed at [facility]    Culture   Final    NORMAL  OROPHARYNGEAL FLORA Performed at [facility]    Report Status 08/24/2014 FINAL  Final  Culture, blood (routine x 2)     Status: None (Preliminary result)   Collection Time: 08/23/14  6:25 AM  Result Value Ref Range Status   Specimen Description BLOOD RIGHT ARM  Final   Special  Requests BOTTLES DRAWN AEROBIC AND ANAEROBIC 10CC  Final   Culture  Setup Time   Final    08/23/2014 10:01 Performed at [facility]    Culture   Final           BLOOD CULTURE RECEIVED NO GROWTH TO DATE CULTURE WILL BE HELD FOR 5 DAYS BEFORE ISSUING A FINAL NEGATIVE REPORT Performed at [facility]    Report Status PENDING  Incomplete  Culture, blood (routine x 2)     Status: None (Preliminary result)   Collection Time: 08/23/14  6:29 AM  Result Value Ref Range Status   Specimen Description BLOOD RIGHT HAND  Final   Special Requests BOTTLES DRAWN AEROBIC AND ANAEROBIC 5CC  Final   Culture  Setup Time   Final    08/23/2014 10:01 Performed at [facility]    Culture   Final           BLOOD CULTURE RECEIVED NO GROWTH TO DATE CULTURE WILL BE HELD FOR 5 DAYS BEFORE ISSUING A FINAL NEGATIVE REPORT Performed at [facility]    Report Status PENDING  Incomplete    Studies/Results: No results found.  Assessment: He continues to tolerate his stent.  His creatinine is improving and his white count has decreased as well.  He has not had any fever over the past 24 hours on oral antibiotics (Cipro).  Plan: He will follow-up with me as an outpatient for treatment of his ureteral calculus.  Jayonna Meyering C 08/25/2014, 8:58 AM

## 2014-08-25 NOTE — Progress Notes (Signed)
Clinical Social Work Department CLINICAL SOCIAL WORK PLACEMENT NOTE 08/25/2014  Patient:  Cody [name],Cody [name]  Account Number:  [id number] Admit date:  08/20/2014  Clinical Social Worker:  Unk LightningHOLLY Zamoria Boss, LCSW  Date/time:  08/25/2014 10:30 AM  Clinical Social Work is seeking post-discharge placement for this patient at the following level of care:   SKILLED NURSING   (*CSW will update this form in Epic as items are completed)   08/25/2014  Patient/family provided with [facility] Department of Clinical Social Work's list of facilities offering this level of care within the geographic area requested by the patient (or if unable, by the patient's family).  08/25/2014  Patient/family informed of their freedom to choose among providers that offer the needed level of care, that participate in Medicare, Medicaid or managed care program needed by the patient, have an available bed and are willing to accept the patient.  08/25/2014  Patient/family informed of [facility]' ownership interest in [facility], as well as of the fact that they are under no obligation to receive care at this facility.  PASARR submitted to EDS on 08/25/2014 PASARR number received on 08/25/2014  FL2 transmitted to all facilities in geographic area requested by pt/family on  08/25/2014 FL2 transmitted to all facilities within larger geographic area on   Patient informed that his/her managed care company has contracts with or will negotiate with  certain facilities, including the following:     Patient/family informed of bed offers received:   Patient chooses bed at  Physician recommends and patient chooses bed at    Patient to be transferred to  on   Patient to be transferred to facility by  Patient and family notified of transfer on  Name of family member notified:    The following physician request were entered in Epic:   Additional Comments: 08/25/14-Pt deciding to go home

## 2014-08-29 LAB — CULTURE, BLOOD (ROUTINE X 2)
Culture: NO GROWTH
Culture: NO GROWTH

## 2014-09-02 ENCOUNTER — Other Ambulatory Visit (HOSPITAL_COMMUNITY): Payer: Self-pay | Admitting: Urology

## 2014-09-07 ENCOUNTER — Other Ambulatory Visit: Payer: Self-pay | Admitting: Urology

## 2014-09-07 ENCOUNTER — Encounter (HOSPITAL_COMMUNITY): Payer: Self-pay | Admitting: *Deleted

## 2014-09-08 ENCOUNTER — Encounter (HOSPITAL_COMMUNITY): Payer: Self-pay | Admitting: *Deleted

## 2014-09-12 ENCOUNTER — Ambulatory Visit (HOSPITAL_COMMUNITY): Payer: Medicare HMO

## 2014-09-12 ENCOUNTER — Encounter (HOSPITAL_COMMUNITY): Payer: Self-pay | Admitting: General Practice

## 2014-09-12 ENCOUNTER — Encounter (HOSPITAL_COMMUNITY)
Admission: RE | Disposition: A | Discharge status: Home / Self Care | Payer: Self-pay | Source: Ambulatory Visit | Attending: Urology

## 2014-09-12 ENCOUNTER — Ambulatory Visit (HOSPITAL_COMMUNITY)
Admission: RE | Admit: 2014-09-12 | Discharge: 2014-09-12 | Disposition: A | Discharge status: Home / Self Care | Payer: Medicare HMO | Source: Ambulatory Visit | Attending: Urology | Admitting: Urology

## 2014-09-12 DIAGNOSIS — E119 Type 2 diabetes mellitus without complications: Secondary | ICD-10-CM | POA: Insufficient documentation

## 2014-09-12 DIAGNOSIS — G20 Parkinson's disease: Secondary | ICD-10-CM | POA: Insufficient documentation

## 2014-09-12 DIAGNOSIS — N200 Calculus of kidney: Secondary | ICD-10-CM | POA: Insufficient documentation

## 2014-09-12 DIAGNOSIS — I10 Essential (primary) hypertension: Secondary | ICD-10-CM | POA: Insufficient documentation

## 2014-09-12 DIAGNOSIS — N201 Calculus of ureter: Secondary | ICD-10-CM

## 2014-09-12 HISTORY — DX: Calculus of kidney: N20.0

## 2014-09-12 LAB — GLUCOSE, CAPILLARY: Glucose-Capillary: 178 mg/dL — ABNORMAL HIGH (ref 70–99)

## 2014-09-12 SURGERY — LITHOTRIPSY, ESWL
Anesthesia: LOCAL | Laterality: Right

## 2014-09-12 MED ORDER — DIPHENHYDRAMINE HCL 25 MG PO CAPS
25.0000 mg | ORAL_CAPSULE | ORAL | Status: AC
  Administered 2014-09-12: 25 mg via ORAL
  Filled 2014-09-12: qty 1

## 2014-09-12 MED ORDER — OXYCODONE-ACETAMINOPHEN 10-325 MG PO TABS: 1.0000 | ORAL_TABLET | ORAL | Status: DC | PRN

## 2014-09-12 MED ORDER — SODIUM CHLORIDE 0.9 % IV SOLN
INTRAVENOUS | Status: DC
  Administered 2014-09-12: 11:00:00 via INTRAVENOUS

## 2014-09-12 MED ORDER — CIPROFLOXACIN HCL 500 MG PO TABS
500.0000 mg | ORAL_TABLET | ORAL | Status: AC
  Administered 2014-09-12: 500 mg via ORAL
  Filled 2014-09-12: qty 1

## 2014-09-12 MED ORDER — TAMSULOSIN HCL 0.4 MG PO CAPS: 0.4000 mg | ORAL_CAPSULE | ORAL | Status: DC

## 2014-09-12 MED ORDER — DIAZEPAM 5 MG PO TABS
10.0000 mg | ORAL_TABLET | ORAL | Status: AC
  Administered 2014-09-12: 10 mg via ORAL
  Filled 2014-09-12: qty 2

## 2014-09-12 NOTE — Discharge Instructions (Signed)

## 2014-09-12 NOTE — Interval H&P Note (Signed)
History and Physical Interval Note:  09/12/2014 6:45 AM  Cody [name]  has presented today for surgery, with the diagnosis of RIGHT UPJ STONE   The various methods of treatment have been discussed with the patient and family. After consideration of risks, benefits and other options for treatment, the patient has consented to  Procedure(s): RIGHT EXTRACORPOREAL SHOCK WAVE LITHOTRIPSY RIGHT  (ESWL) (Right) as a surgical intervention .  The patient's history has been reviewed, patient examined, no change in status, stable for surgery.  I have reviewed the patient's chart and labs.  Questions were answered to the patient's satisfaction.     Garnett FarmTTELIN,Kenlee Maler C

## 2014-09-12 NOTE — Op Note (Signed)
See [facility] OP note scanned into chart. Also because of the size, density, location and other factors that cannot be anticipated I feel this will likely be a staged procedure. This fact supersedes any indication in the scanned [facility] operative note to the contrary.  

## 2014-09-12 NOTE — H&P (View-Only) (Signed)
Urology Consult  CC: Referring physician: Dr. Ashok PallA Devine Reason for referral: right UPJ stone  History of Present Illness: Cody [name] is a 68 year old male with a history of calculus disease having passed 2 stones spontaneously in the past. He developed progressively worse pain in his right flank region that did not radiate into the abdomen. It was not associated with any gross hematuria but was associated with nausea, vomiting and decreased appetite. This occurred over the course of 48 hour period and he reported having subjective fever at home. He presented to the emergency room and a where he was found to have a fever of 102. A CT scan was obtained which revealed a 1.2 cm stone located at the right UPJ. He reports that he has never had a urinary tract infection to his knowledge. In the emergency room he was found to have a urinalysis that had no nitrites but was positive for leukocyte esterase. There were 11-20 white blood cells and 3-6 red blood cells but no bacteria were reported. His white count was minimally elevated at 12.9. I was contacted regarding his stone and fever and recommended [facility] to [facility] [place] in case a stent was necessary however I did not feel that that was indicated.  Past Medical History  Diagnosis Date  . Back pain     lumbar  . Urgency of urination   . Hammer toe   . Diabetes mellitus   . Unspecified essential hypertension   . Parkinson disease    Past Surgical History  Procedure Laterality Date  . Toe surgery Right     big toe  . Toe surgery Left     2nd toe    Medications:  Prior to Admission:  Prescriptions prior to admission  Medication Sig Dispense Refill Last Dose  . atenolol (TENORMIN) 25 MG tablet Take 25 mg by mouth daily.   08/20/2014 at Unknown time  . carbidopa-levodopa-entacapone (STALEVO) 50-200-200 MG per tablet Take 1 tablet by mouth 4 (four) times daily. At 6am, 10am, 2pm, and 6pm   08/20/2014 at  Unknown time  . Cholecalciferol (VITAMIN D3) 3000 UNITS TABS Take 1 tablet by mouth daily.   08/20/2014 at Unknown time  . glipiZIDE (GLUCOTROL) 5 MG tablet Take 5 mg by mouth 2 (two) times daily before a meal.   08/20/2014 at Unknown time  . lisinopril-hydrochlorothiazide (PRINZIDE,ZESTORETIC) 10-12.5 MG per tablet Take 1 tablet by mouth daily.   08/20/2014 at Unknown time  . metFORMIN (GLUCOPHAGE) 500 MG tablet Take 1,000 mg by mouth 2 (two) times daily with a meal.    08/20/2014 at Unknown time  . sertraline (ZOLOFT) 100 MG tablet Take 100 mg by mouth daily.    08/20/2014 at Unknown time    Allergies:  Allergies  Allergen Reactions  . Amoxapine And Related Swelling  . Amoxicillin Swelling    Family History  Problem Relation Age of Onset  . High blood pressure Mother   . Diabetes Mother   . Stroke Father     Social History:  reports that he has never smoked. He does not have any smokeless tobacco history on file. He reports that he does not drink alcohol or use illicit drugs.  Review of Systems: Pertinent items are noted in HPI. A comprehensive review of systems was negative except as noted above  Physical Exam:  Vital signs in last 24 hours: Temp:  [98.1 F (36.7 C)-102.7 F (39.3 C)] 98.1 F (36.7 C) (11/01  16100514) Pulse Rate:  [94-119] 119 (11/01 0514) Resp:  [15-26] 20 (11/01 0514) BP: (87-113)/(54-78) 102/61 mmHg (11/01 0514) SpO2:  [92 %-98 %] 92 % (11/01 0514) Weight:  [108.41 kg (239 lb)-108.6 kg (239 lb 6.7 oz)] 108.6 kg (239 lb 6.7 oz) (11/01 0514) General appearance: alert and appears stated age in no apparent distress Head: Normocephalic, without obvious abnormality, atraumatic Eyes: conjunctivae/corneas clear. EOM's intact.  Oropharynx: moist mucous membranes Neck: supple, symmetrical, trachea midline Resp: normal respiratory effort Cardio: regular rate and rhythm Back: symmetric, no curvature. ROM normal. Minimal right CVA tenderness. GI: soft, non-tender;  bowel sounds normal; no masses,  no organomegaly Male genitalia: penis: normal male phallus with no lesions or discharge.Testes: bilaterally descended with no masses or tenderness. no hernias Extremities: extremities normal, atraumatic, no cyanosis or edema Skin: Skin color normal. No visible rashes or lesions Neurologic: Grossly normal  Laboratory Data:   Recent Labs  08/20/14 2003 08/21/14 0723  WBC 12.9* 9.2  HGB 14.3 13.6  HCT 42.5 40.9   BMET  Recent Labs  08/20/14 2003  NA 134*  K 4.0  CL 94*  CO2 25  GLUCOSE 202*  BUN 26*  CREATININE 1.63*  CALCIUM 9.1   No results for input(s): LABPT, INR in the last 72 hours. No results for input(s): LABURIN in the last 72 hours. Results for orders placed or performed during the hospital encounter of 01/02/14  Culture, blood (routine x 2)     Status: None   Collection Time: 01/02/14 11:43 PM  Result Value Ref Range Status   Specimen Description BLOOD RIGHT ARM  Final   Special Requests BOTTLES DRAWN AEROBIC AND ANAEROBIC [facility] EACH  Final   Culture  Setup Time   Final    01/03/2014 08:24 Performed at [facility]   Culture   Final    KLEBSIELLA OXYTOCA Note: Gram Stain Report Called to,Read Back By and Verified With: Judee ClaraRIMAINE BUTLER 01/04/14 0452A FULKC Performed at [facility]   Report Status 01/06/2014 FINAL  Final   Organism ID, Bacteria KLEBSIELLA OXYTOCA  Final      Susceptibility   Klebsiella oxytoca - MIC*    AMPICILLIN >=32 RESISTANT Resistant     AMPICILLIN/SULBACTAM 16 INTERMEDIATE Intermediate     CEFAZOLIN >=64 RESISTANT Resistant     CEFEPIME <=1 SENSITIVE Sensitive     CEFTAZIDIME <=1 SENSITIVE Sensitive     CEFTRIAXONE <=1 SENSITIVE Sensitive     CIPROFLOXACIN <=0.25 SENSITIVE Sensitive     GENTAMICIN <=1 SENSITIVE Sensitive     IMIPENEM <=0.25 SENSITIVE Sensitive     PIP/TAZO <=4 SENSITIVE Sensitive     TOBRAMYCIN <=1 SENSITIVE Sensitive     TRIMETH/SULFA <=20 SENSITIVE Sensitive      * KLEBSIELLA OXYTOCA  Culture, blood (routine x 2)     Status: None   Collection Time: 01/02/14 11:44 PM  Result Value Ref Range Status   Specimen Description BLOOD RIGHT FOREARM  Final   Special Requests BOTTLES DRAWN AEROBIC AND ANAEROBIC 5CC EACH  Final   Culture  Setup Time   Final    01/03/2014 08:25 Performed at [facility]   Culture   Final    NO GROWTH 5 DAYS Performed at [facility]   Report Status 01/09/2014 FINAL  Final  Gram stain     Status: None   Collection Time: 01/03/14  5:26 AM  Result Value Ref Range Status   Specimen Description URINE, CLEAN CATCH  Final   Special  Requests NONE  Final   Gram Stain   Final    CYTOSPIN SLIDE SQUAMOUS EPITHELIAL CELLS PRESENT WBC PRESENT, PREDOMINANTLY PMN GRAM NEGATIVE RODS   Report Status 01/03/2014 FINAL  Final  Culture, blood (routine x 2)     Status: None   Collection Time: 01/04/14  9:08 AM  Result Value Ref Range Status   Specimen Description BLOOD LEFT FOREARM  Final   Special Requests BOTTLES DRAWN AEROBIC AND ANAEROBIC 10CC  Final   Culture  Setup Time   Final    01/04/2014 14:03 Performed at [facility]   Culture   Final    NO GROWTH 5 DAYS Performed at [facility]   Report Status 01/10/2014 FINAL  Final  Culture, blood (routine x 2)     Status: None   Collection Time: 01/04/14  9:15 AM  Result Value Ref Range Status   Specimen Description BLOOD LEFT HAND  Final   Special Requests BOTTLES DRAWN AEROBIC AND ANAEROBIC 10CC  Final   Culture  Setup Time   Final    01/04/2014 14:03 Performed at [facility]   Culture   Final    NO GROWTH 5 DAYS Performed at [facility]   Report Status 01/10/2014 FINAL  Final   Creatinine:  Recent Labs  08/20/14 2003  CREATININE 1.63*    Imaging: Ct Abdomen Pelvis Wo Contrast  08/20/2014   CLINICAL DATA:  Right flank pain  EXAM: CT ABDOMEN AND PELVIS WITHOUT CONTRAST  TECHNIQUE: Multidetector CT imaging of the  abdomen and pelvis was performed following the standard protocol without IV contrast.  COMPARISON:  None.  FINDINGS: Lower chest: There are multiple small nodules identified in both lungs. Index nodule in the right middle lobe measures 5 mm, image 9 of series 203. Lingular nodule measures 4 mm, image 13/ series 203. No pleural effusion noted  Hepatobiliary: Diffuse fatty infiltration of the liver. The gallbladder is normal. There is no biliary dilatation. Normal appearance of the pancreas.  Pancreas: Normal appearance of the pancreas.  Spleen: The spleen is on unremarkable.  Adrenals/Urinary Tract: The adrenal glands are both normal. 3 mm nonobstructing calculus is identified within the upper pole the left kidney. Low-attenuation structure in the left kidney measures 1.8 cm and is incompletely characterized without IV contrast. There is a 5 mm stone within the lower pole of the right kidney. At the right UPJ there is a 1.2 by 1.1 cm stone. This causes mild right hydronephrosis. The urinary bladder appears normal.  Stomach/Bowel: The stomach is normal. The small bowel loops have a normal course and caliber. No obstruction. Normal appearance of the colon.  Vascular/Lymphatic: Calcified atherosclerotic disease involves the abdominal aorta. There is no aneurysm. There is no retroperitoneal adenopathy identified no pelvic or inguinal adenopathy noted.  Reproductive: Prostate gland and seminal vesicles appear normal.  Other: There is no free fluid or fluid collections identified within the abdomen or pelvis.  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 There are bilateral L5 pars defects identified. Anterolisthesis of L5 on S1 appears normal.  IMPRESSION: 1. Large stone is identified at the right UPJ. This causes right-sided hydronephrosis. Smaller nonobstructing calculus is noted within the inferior pole  collecting system of the right kidney. 2. Advanced lumbar spondylosis. 3. There are multiple indeterminate nodules identified within the lung bases. Consider followup common nonemergent CT of the chest for further investigation.  Electronically Signed   By: Signa Kellaylor  Stroud M.D.   On: 08/20/2014 22:02   Dg Chest 2 View  08/21/2014   CLINICAL DATA:  Right-sided flank pain with fever.  Kidney stone.  EXAM: CHEST  2 VIEW  COMPARISON:  01/02/2014  FINDINGS: Normal heart size. No pleural effusion identified. There is no airspace consolidation. Asymmetric elevation of the right hemidiaphragm is identified. No pleural effusion or edema identified. There is no airspace consolidation. Spondylosis identified within the thoracic spine.  IMPRESSION: 1. No acute findings. 2. Asymmetric elevation of right hemidiaphragm.   Electronically Signed   By: Signa Kellaylor  Stroud M.D.   On: 08/21/2014 00:06    The CT scan images were independently reviewed. Impression/Assessment:  1. He has a right UPJ stone that has caused some discomfort over the past 48 hours. While he may still need the placement of a double-J stent I did not feel that was indicated initially as he had minimal urinary findings to suggest a significant infection, reported never having had a previous UTI and had only a minimal elevation of his white blood cell count which can be seen for this degree with kidney stone pain alone. He has been placed on antibiotic therapy and his white blood cell count has decreased and out into the normal range. His right flank pain has improved and he remains afebrile with stable vital signs. We had a long discussion about the treatment options for his stone and the possible need for stent placement at some point. I told him at this point I was going to hold off on stent placement but if he had any signs or symptoms to suggest an infection that was worsening then a stent would need to be placed. The treatment options for his stone  within discussed including a percutaneous nephrolithotomy, ureteroscopy with laser lithotripsy and ESWL. We discussed the pluses and minuses of each of these treatment options. I told him that once he was out of the hospital we would need to set up a time for treatment and based on the patient's stone size and its location I feel that he would be best managed with lithotripsy.  2. He has bilateral, nonobstructing renal calculi. These are not causing any difficulty currently but will need to be followed over time with serial imaging. Workup of the cause for his stone formation will also need to be undertaken with a 24-hour urine study.  3. He did have a lesion in the left kidney that appeared to be decreased density relative to the surrounding renal parenchyma most consistent with a left renal cyst.  Plan:  1. His urine has been cultured and will be followed up. 2. Continue intravenous antibiotics. 3. His stone is of a size that spontaneous passage is unlikely and therefore he does not need Flomax for medical expulsive therapy. 4. He will be scheduled for definitive treatment of his right UPJ stone as an outpatient.  Cody [name] C 08/21/2014, 8:02 AM

## 2014-10-11 ENCOUNTER — Other Ambulatory Visit: Payer: Self-pay | Admitting: Family Medicine

## 2014-10-11 DIAGNOSIS — R918 Other nonspecific abnormal finding of lung field: Secondary | ICD-10-CM

## 2014-10-25 ENCOUNTER — Inpatient Hospital Stay: Admission: RE | Admit: 2014-10-25 | Payer: Medicare HMO | Source: Ambulatory Visit

## 2014-10-28 ENCOUNTER — Ambulatory Visit
Admission: RE | Admit: 2014-10-28 | Discharge: 2014-10-28 | Disposition: A | Discharge status: Home / Self Care | Payer: PPO | Source: Ambulatory Visit | Attending: Family Medicine | Admitting: Family Medicine

## 2014-10-28 DIAGNOSIS — R918 Other nonspecific abnormal finding of lung field: Secondary | ICD-10-CM

## 2015-01-11 ENCOUNTER — Encounter: Payer: Self-pay | Admitting: Neurology

## 2015-01-11 ENCOUNTER — Ambulatory Visit (INDEPENDENT_AMBULATORY_CARE_PROVIDER_SITE_OTHER): Payer: PPO | Admitting: Neurology

## 2015-01-11 VITALS — BP 108/68 | HR 72 | Ht 70.0 in | Wt 232.0 lb

## 2015-01-11 DIAGNOSIS — G20A1 Parkinson's disease without dyskinesia, without mention of fluctuations: Secondary | ICD-10-CM

## 2015-01-11 DIAGNOSIS — K117 Disturbances of salivary secretion: Secondary | ICD-10-CM

## 2015-01-11 DIAGNOSIS — K5901 Slow transit constipation: Secondary | ICD-10-CM

## 2015-01-11 DIAGNOSIS — G4733 Obstructive sleep apnea (adult) (pediatric): Secondary | ICD-10-CM

## 2015-01-11 DIAGNOSIS — F458 Other somatoform disorders: Secondary | ICD-10-CM | POA: Diagnosis not present

## 2015-01-11 DIAGNOSIS — R1319 Other dysphagia: Secondary | ICD-10-CM

## 2015-01-11 DIAGNOSIS — G20 Parkinson's disease: Secondary | ICD-10-CM | POA: Diagnosis not present

## 2015-01-11 MED ORDER — ROPINIROLE HCL ER 12 MG PO TB24: 1.0000 | ORAL_TABLET | Freq: Every day | ORAL | Status: DC

## 2015-01-11 MED ORDER — CARBIDOPA-LEVODOPA ER 50-200 MG PO TBCR: 1.0000 | EXTENDED_RELEASE_TABLET | Freq: Every day | ORAL | Status: DC

## 2015-01-11 NOTE — Progress Notes (Signed)
Cody [name] was seen today in the movement disorders clinic for neurologic consultation.  His PCP is [name], MD.  The consultation is for the evaluation of PD.  Prior records from [name] were reviewed.  He was last seen in 01/2013 but apparently had been seen there since 2005 with sx's since 2003.  He has been seen at the [facility] since 2014.    Pt states that in 09/2004 he was noting L leg dragging and he was then referred to [name] and dx with PD.  He developed tremor a few years later.  He was started on requip and titrated up on the drug but he had trouble tolerating it because of EDS and was only taking it at night.  He doesn't think that he was ever on just carbidopa/levodopa and has always been on Stalevo 200 mg.  He is on it 5 times per day.  He takes it at 6am/8:30am/11am/1:30 and 4pm.  He is now on 12 mg qhs of requip.  According to [name] notes, she had tried him on daytime dosages in the past and it made him very sleepy and so he consolidated all of the medication at night.  Therefore, she switched him to Requip XL 12 mg, but when he changed to the [facility] they switched him back to regular ropinirole and while the bottle says 4 mg 3 times a day, the patient states that he was always told to take all 12 mg at night.  He was seen by [name] at the [facility] in [facility] in June and they were going to re-evaluate possibility for DBS in November but then he had an episode of nephrolithiasis and didn't go back.  States that wants to try and get care locally.      He goes to the [employer] faithfully and has gotten around much better with faithful exercise.  Specific Symptoms:  Tremor: Yes.   (very little in both hands) Voice: hypophonic Sleep: trouble staying asleep  Vivid Dreams:  Yes.   (some)  Acting out dreams:  No. Wet Pillows: Yes.   Postural symptoms:  Yes.   (states that he has freezing spells, 5-6 times per day)  Falls?  Yes.   (estimates about 3 per  year) Bradykinesia symptoms: shuffling gait, slow movements and difficulty getting out of a chair Loss of smell:  Yes.   Loss of taste:  Yes.   Urinary Incontinence:  No. Difficulty Swallowing:  Yes.   Handwriting, micrographia: No. Trouble with ADL's:  Yes.   (mild - trouble putting on pants while standing up)  Trouble buttoning clothing: Yes.   Depression:  No. Memory changes:  No. (nothing significant) Hallucinations:  No.  visual distortions: Yes.   but very rarely N/V:  Yes.  , but rarely Lightheaded:  No.  Syncope: No. Diplopia:  No. Dyskinesia:  Yes.   (was on amantadine in the past and doesn't know why not on it now)  Neuroimaging has not previously been performed.   PREVIOUS MEDICATIONS: Sinemet and Amantadine  (unsure why d/c); azilect (? D/C due to cost); not able to tolerate daytime dosages of requip  ALLERGIES:   Allergies  Allergen Reactions  . Amoxapine And Related Swelling  . Amoxicillin Swelling  . Ceftriaxone Rash    Morbilliform rash on back, flexor creases, chest  . Imipenem Rash    Confluent maculopapular/morbilliform rash on back, arms, chest    CURRENT MEDICATIONS:  Outpatient Encounter Prescriptions as of 01/11/2015  Medication Sig  . carbidopa-levodopa-entacapone (STALEVO) 50-200-200 MG per tablet Take 1 tablet by mouth 5 (five) times daily.   . Cholecalciferol (VITAMIN D3) 3000 UNITS TABS Take 1 tablet by mouth daily.  Marland Kitchen. glipiZIDE (GLUCOTROL) 5 MG tablet Take 5 mg by mouth 2 (two) times daily before a meal.  . guaifenesin (HUMIBID E) 400 MG TABS tablet Take 400 mg by mouth every 4 (four) hours as needed.  Marland Kitchen. lisinopril-hydrochlorothiazide (PRINZIDE,ZESTORETIC) 10-12.5 MG per tablet Take 1 tablet by mouth daily.  Marland Kitchen. rOPINIRole (REQUIP) 3 MG tablet Take 3 mg by mouth 4 (four) times daily.  . sertraline (ZOLOFT) 100 MG tablet Take 100 mg by mouth daily.   . [DISCONTINUED] ciprofloxacin (CIPRO) 500 MG tablet Take 1 tablet (500 mg total) by mouth 2 (two)  times daily.  . [DISCONTINUED] docusate sodium 100 MG CAPS Take 100 mg by mouth 2 (two) times daily.  . [DISCONTINUED] hydrocortisone cream 1 % Apply topically 3 (three) times daily as needed for itching.  . [DISCONTINUED] metFORMIN (GLUCOPHAGE) 500 MG tablet Take 1,000 mg by mouth 2 (two) times daily with a meal.   . [DISCONTINUED] mirabegron ER (MYRBETRIQ) 25 MG TB24 tablet Take 1 tablet (25 mg total) by mouth daily.  . [DISCONTINUED] oxyCODONE-acetaminophen (PERCOCET) 10-325 MG per tablet Take 1-2 tablets by mouth every 4 (four) hours as needed for pain. Maximum dose per 24 hours - 8 pills  . [DISCONTINUED] polyethylene glycol (MIRALAX / GLYCOLAX) packet Take 17 g by mouth daily as needed for mild constipation.  . [DISCONTINUED] saccharomyces boulardii (FLORASTOR) 250 MG capsule Take 1 capsule (250 mg total) by mouth 2 (two) times daily.  . [DISCONTINUED] senna (SENOKOT) 8.6 MG TABS tablet Take 2 tablets (17.2 mg total) by mouth at bedtime.  . [DISCONTINUED] tamsulosin (FLOMAX) 0.4 MG CAPS capsule Take 1 capsule (0.4 mg total) by mouth daily after supper.    PAST MEDICAL HISTORY:   Past Medical History  Diagnosis Date  . Back pain     lumbar  . Urgency of urination   . Hammer toe   . Diabetes mellitus   . Unspecified essential hypertension   . Parkinson disease   . Renal stone     PAST SURGICAL HISTORY:   Past Surgical History  Procedure Laterality Date  . Toe surgery Right     big toe  . Toe surgery Left     2nd toe  . Cystoscopy/retrograde/ureteroscopy Right 08/22/2014    Procedure: Cystoscopy, Right Retrograde Pyelogram, Right Ureteral Stent Placement;  Surgeon: Garnett FarmMark C Ottelin, MD;  Location: [facility];  Service: Urology;  Laterality: Right;    SOCIAL HISTORY:   History   Social History  . Marital Status: Widowed    Spouse Name: N/A  . Number of Children: 1  . Years of Education: N/A   Occupational History  . retired     Engineer, [employer]uralsold flowers to [employer]   Social History  Main Topics  . Smoking status: Never Smoker   . Smokeless tobacco: Not on file  . Alcohol Use: No  . Drug Use: No  . Sexual Activity: Not on file   Other Topics Concern  . Not on file   Social History Narrative    FAMILY HISTORY:   Family Status  Relation Status Death Age  . Mother Deceased     heart failure, DM  . Father Deceased     stroke  . Daughter Alive     healthy  ROS:  A complete 10 system review of systems was obtained and was unremarkable apart from what is mentioned above.  PHYSICAL EXAMINATION:    VITALS:   Filed Vitals:   01/11/15 1341  BP: 108/68  Pulse: 72  Height: 5\' 10"  (1.778 m)  Weight: 232 lb (105.235 kg)   The patient took his last dose of levodopa about one hour prior to being examined.  GEN:  The patient appears stated age and is in NAD. HEENT:  Normocephalic, atraumatic.  The mucous membranes are moist. The superficial temporal arteries are without ropiness or tenderness. CV:  RRR Lungs:  CTAB Neck/HEME:  There are no carotid bruits bilaterally.  Neurological examination:  Orientation: The patient is alert and oriented x3. Fund of knowledge is appropriate.  Recent and remote memory are intact.  Attention and concentration are normal.    Able to name objects and repeat phrases. Cranial nerves: There is good facial symmetry.  There is significant facial hypomimia.  Pupils are equal round and reactive to light bilaterally. Fundoscopic exam reveals clear margins bilaterally. Extraocular muscles are intact. The visual fields are full to confrontational testing. The speech is fluent and clear.  He is mildly hypophonic.  Soft palate rises symmetrically and there is no tongue deviation. Hearing is intact to conversational tone. Sensation: Sensation is intact to light and pinprick throughout (facial, trunk, extremities). Vibration is decreased at the bilateral big toe. There is no extinction with double simultaneous stimulation. There is no sensory  dermatomal level identified. Motor: Strength is 5/5 in the bilateral upper and lower extremities.   Shoulder shrug is equal and symmetric.  There is no pronator drift. Deep tendon reflexes: Deep tendon reflexes are 1/4 at the bilateral biceps, triceps, brachioradialis, patella and absent at the bilateral achilles. Plantar responses are downgoing bilaterally.  Movement examination: Tone: There is mild increased tone in the left lower extremity.  Otherwise, tone in the left upper, right upper and right lower extremity were normal.  Abnormal movements: None Coordination:  Patient has mild decremation with toe taps on the left.  Otherwise, rapid alternating movements were normal. Gait and Station: The patient has significant difficulty arising out of a deep-seated chair without the use of the hands.  He makes several unsuccessful attempts.  In the end, he is able to push off the chair and arise with the use of the hands.  I did not see any freezing, but he has start hesitation and he shuffles with the first few steps, but once he gets out of the doorway his stride length is much improved.  However, his gait is somewhat spastic when he walks.       ASSESSMENT/PLAN:  1.  Idiopathic, akinetic rigid Parkinson's disease, diagnosed in 2005 with symptoms dating back perhaps to 2003  -The patient's Parkinson's disease is now complicated by freezing/motor fluctuations and dyskinesia.    -I had a long discussion with the patient and his girlfriend.  Much greater than 50% of the visit was spent in counseling.  We talked about pathophysiology.  We talked about options.  He is taking 12 mg of Requip at night and nothing during the day.  He has been unable to tolerate daytime dosages of Requip in the past.  I explained to the patient that regular ropinirole is meant to be taken 3 times per day and not just at night.  I would recommend that he try Requip XL instead and see if he tolerates this.  He is going to  see if  this is an option through the [facility] as it is expensive for him to pay for his medications.  -I would recommend adding carbidopa/levodopa 50/200 at bedtime for the nighttime rigidity as that was a huge complaint of the patient's.  -I would continue on Stalevo 200 mg at 6am/8:30am/11am/1:30 and 4pm  -He used to be on amantadine but is off of it now.  It appears that perhaps it was discontinued when he went to the [place].  I don't think he had side effects.  It may be an option in the future for the treatment of dyskinesia, although I saw none today.  However, he does describe it.  -We had a lengthy discussion about DBS therapy.  We talked about what it would help and what it would not help with.  He has had a similar conversation with [name] at the [facility] in [place].  He may wish to pursue surgery through there because of cost.  -I would recommend an MRI of the brain, primarily because the patient walks with somewhat of a spastic gait.  He has no neck pain, so did not recommend an MRI of the cervical spine.  Again, he wishes to pursue this through the [facility] and his neurologist there. 2.  Sialorrhea  -Talked about the value of Myobloc.  He is not interested right now, but will let me know if this symptom gets worse. 3.  Dysphagia.  -I recommend a modified barium swallow examination.  He is going to see if he can get this done through the [facility], as expense is a big issue. 4.  Constipation  -This is very common with Parkinson's disease.  The patient was given a copy of the rancho recipe. 5.  Obstructive sleep apnea syndrome  -The patient is having difficulty tolerating CPAP, but was just started on it about 3 months ago.  I encouraged him to continue to try to use CPAP.  We talked about morbidity and mortality associated with untreated sleep apnea 6.  Follow-up in the next few months, sooner should new neurologic issues arise.  Much greater than 50% of  this 80 minute visit was in counseling and coordinating care, as above.

## 2015-01-11 NOTE — Patient Instructions (Addendum)
1. Constipation and Parkinson's disease:  1.Rancho recipe for constipation in Parkinsons Disease:  -1 cup of bran, 2 cups of applesauce in 1 cup of prune juice 2.  Increase fiber intake (Metamucil,vegetables) 3.  Regular, moderate exercise can be beneficial. 4.  Avoid medications causing constipation, such as medications like antacids with calcium or magnesium 5.  Laxative overuse should be avoided. 6.  Stool softeners (Colace) can help with chronic constipation.  We recommend you have an MR Brain with and without contrast due to spastic gait and Modified Barium Swallow due to neurologic dysphagia.

## 2015-01-16 ENCOUNTER — Telehealth: Payer: Self-pay | Admitting: Neurology

## 2015-01-16 NOTE — Telephone Encounter (Signed)
Pt called wanting to speak to a nurse regarding the script for CARBIDOPA-LEVODOPA and ROPINIROL  Pt would like for these to be called in to the [facility] C/b [phone]

## 2015-01-16 NOTE — Telephone Encounter (Signed)
Patient made aware we can not send [facility] directly to the [facility]. He will need to show the prescriptions that have been written to his doctor at the [facility].

## 2015-01-17 ENCOUNTER — Telehealth: Payer: Self-pay | Admitting: Neurology

## 2015-01-17 NOTE — Telephone Encounter (Signed)
Left message on machine for patient to call back.

## 2015-01-17 NOTE — Telephone Encounter (Signed)
Pt wants to talk to someone about why Dr Tat changed his medication Carbidopa and Ropinirole please call [phone]

## 2015-01-17 NOTE — Telephone Encounter (Signed)
Spoke with patient and he states the [facility] has not know the reason for the medication changes to write the prescriptions. Last office visit note faxed to Dr Mauri ReadingMullis at the [facility] at [phone] with confirmation received.

## 2015-01-30 ENCOUNTER — Telehealth: Payer: Self-pay | Admitting: Neurology

## 2015-01-30 NOTE — Telephone Encounter (Signed)
Left message on machine for patient to call back. To see if we can help patient with something instead of him coming in for appt. Patient just seen 2 weeks ago. Awaiting call back.

## 2015-01-31 ENCOUNTER — Ambulatory Visit (INDEPENDENT_AMBULATORY_CARE_PROVIDER_SITE_OTHER): Payer: PPO | Admitting: Neurology

## 2015-01-31 ENCOUNTER — Encounter: Payer: Self-pay | Admitting: Neurology

## 2015-01-31 VITALS — BP 134/70 | HR 94 | Ht 70.0 in | Wt 234.9 lb

## 2015-01-31 DIAGNOSIS — R1319 Other dysphagia: Secondary | ICD-10-CM

## 2015-01-31 DIAGNOSIS — F458 Other somatoform disorders: Secondary | ICD-10-CM | POA: Diagnosis not present

## 2015-01-31 DIAGNOSIS — G20 Parkinson's disease: Secondary | ICD-10-CM | POA: Diagnosis not present

## 2015-01-31 NOTE — Patient Instructions (Signed)
1.  We may end up trying to change your stalevo to rytary but that is very expensive and we would need to check on that availability through the [facility] 2.  Get me a copy of your swallow study from the [facility] 3.  For now, take an extra stalevo at 6-6:30 pm 4.  Take 2 of the 6mg  requip xl (ropinirole HCL SA) at night 5.  Take one of carbidopa/levodopa 50/200 at night before bedtime 6.  If you want to consider DBS surgery, you need to let me know if you want to consider at the [facility] in [place] or here.  [facility] cheaper for you.  You will need neuropsych testing and specialized MRI brain done prior to this 7.  In the future, we may consider apokyn (which is a shot that you can inject when you freeze) but will need to be monitored before it gets started.

## 2015-01-31 NOTE — Progress Notes (Signed)
Cody [name] was seen today in the movement disorders clinic for neurologic consultation.  His PCP is [name], MD.  The consultation is for the evaluation of PD.  Prior records from [name] were reviewed.  He was last seen in 01/2013 but apparently had been seen there since 2005 with sx's since 2003.  He has been seen at the [facility] medical center since 2014.    Pt states that in 09/2004 he was noting L leg dragging and he was then referred to [name] and dx with PD.  He developed tremor a few years later.  He was started on requip and titrated up on the drug but he had trouble tolerating it because of EDS and was only taking it at night.  He doesn't think that he was ever on just carbidopa/levodopa and has always been on Stalevo 200 mg.  He is on it 5 times per day.  He takes it at 6am/8:30am/11am/1:30 and 4pm.  He is now on 12 mg qhs of requip.  According to [name] notes, she had tried him on daytime dosages in the past and it made him very sleepy and so he consolidated all of the medication at night.  Therefore, she switched him to Requip XL 12 mg, but when he changed to the [facility] they switched him back to regular ropinirole and while the bottle says 4 mg 3 times a day, the patient states that he was always told to take all 12 mg at night.  He was seen by [name] at the [facility] in [facility] in June and they were going to re-evaluate possibility for DBS in November but then he had an episode of nephrolithiasis and didn't go back.  States that wants to try and get care locally.      He goes to the [employer] faithfully and has gotten around much better with faithful exercise.  01/31/15 update:  The patient is following up today, much sooner than expected.  He is accompanied by his girlfriend who supplements the history.  He has a history of Parkinson's disease with motor fluctuations.  Last visit, multiple recommendations were given but he did not want me to proceed with these  recommendations as he wanted to pursue them at the [facility] because of cost.  He has been taking ropinirole, 12 mg at night because he is unable to tolerate daytime dosages, but I explained to the patient that regular ropinirole was meant to be dosed 3 times per day.  If he needs a dosed once per day then he needs to change to the XL formulation.  He was recently able to change to Requip XL, 6 mg and is taking 2 of them at night.  I also recommended that we add carbidopa/levodopa 50/200 at night to his Stalevo 200 mg that he is already taking at 6 AM/8:30 AM/11 AM/1:30 PM/4 PM.  He states that he accidentally took 6 dosages of Stalevo one day instead of 5 and actually felt great.  He had no freezing that today.  A modified barium swallow was also recommended, buttock and he wished to pursue that as well as an MRI of the brain through the [name].  He had a modified barium swallow scheduled several times, but ended up canceling it because of days that he was not feeling well or was freezing.  He needs to reschedule that.   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  His girlfriend found him on the floor a few days ago and was on the floor a few hours.  He got down to get something and couldn't get back up.  He has not had any falls.  He just has been experiencing more freezing.  They took him to the podiatrist a few days ago and he ended up freezing while in the bathroom and could not get out.  Neuroimaging has not previously been performed.   PREVIOUS MEDICATIONS: Sinemet and Amantadine  (unsure why d/c); azilect (? D/C due to cost); not able to tolerate daytime dosages of requip  ALLERGIES:   Allergies  Allergen Reactions  . Amoxapine And Related Swelling  . Amoxicillin Swelling  . Ceftriaxone Rash    Morbilliform rash on back, flexor creases, chest  . Imipenem Rash    Confluent maculopapular/morbilliform rash on back, arms, chest    CURRENT  MEDICATIONS:  Outpatient Encounter Prescriptions as of 01/31/2015  Medication Sig  . carbidopa-levodopa (SINEMET CR) 50-200 MG per tablet Take 1 tablet by mouth at bedtime.  . carbidopa-levodopa-entacapone (STALEVO) 50-200-200 MG per tablet Take 1 tablet by mouth 5 (five) times daily.   . Cholecalciferol (VITAMIN D3) 3000 UNITS TABS Take 1 tablet by mouth daily.  Marland Kitchen glipiZIDE (GLUCOTROL) 5 MG tablet Take 5 mg by mouth 2 (two) times daily before a meal.  . guaifenesin (HUMIBID E) 400 MG TABS tablet Take 400 mg by mouth every 4 (four) hours as needed.  Marland Kitchen lisinopril-hydrochlorothiazide (PRINZIDE,ZESTORETIC) 10-12.5 MG per tablet Take 1 tablet by mouth daily.  Marland Kitchen rOPINIRole (REQUIP) 3 MG tablet Take 3 mg by mouth 4 (four) times daily.  . Ropinirole HCl 12 MG TB24 Take 1 tablet (12 mg total) by mouth daily.  . sertraline (ZOLOFT) 100 MG tablet Take 100 mg by mouth daily.     PAST MEDICAL HISTORY:   Past Medical History  Diagnosis Date  . Back pain     lumbar  . Urgency of urination   . Hammer toe   . Diabetes mellitus   . Unspecified essential hypertension   . Parkinson disease   . Renal stone     PAST SURGICAL HISTORY:   Past Surgical History  Procedure Laterality Date  . Toe surgery Right     big toe  . Toe surgery Left     2nd toe  . Cystoscopy/retrograde/ureteroscopy Right 08/22/2014    Procedure: Cystoscopy, Right Retrograde Pyelogram, Right Ureteral Stent Placement;  Surgeon: Garnett Farm, MD;  Location: [facility];  Service: Urology;  Laterality: Right;    SOCIAL HISTORY:   History   Social History  . Marital Status: Widowed    Spouse Name: N/A  . Number of Children: 1  . Years of Education: N/A   Occupational History  . retired     Engineer, structural to [employer]   Social History Main Topics  . Smoking status: Never Smoker   . Smokeless tobacco: Not on file  . Alcohol Use: No  . Drug Use: No  . Sexual Activity: Not on file   Other Topics Concern  . Not on file    Social History Narrative    FAMILY HISTORY:   Family Status  Relation Status Death Age  . Mother Deceased     heart failure, DM  . Father Deceased     stroke  . Daughter Alive     healthy    ROS:  A complete 10 system review of systems  was obtained and was unremarkable apart from what is mentioned above.  PHYSICAL EXAMINATION:    VITALS:   Filed Vitals:   01/31/15 0842  BP: 134/70  Pulse: 94  Height: 5\' 10"  (1.778 m)  Weight: 234 lb 14.4 oz (106.55 kg)  SpO2: 96%   The patient took his last dose of levodopa about one hour prior to being examined.  GEN:  The patient appears stated age and is in NAD. HEENT:  Normocephalic, atraumatic.  The mucous membranes are moist. The superficial temporal arteries are without ropiness or tenderness. CV:  RRR Lungs:  CTAB Neck/HEME:  There are no carotid bruits bilaterally.  Neurological examination:  Orientation: The patient is alert and oriented x3. Fund of knowledge is appropriate.  Recent and remote memory are intact.  Attention and concentration are normal.    Able to name objects and repeat phrases. Cranial nerves: There is good facial symmetry.  There is significant facial hypomimia.   Extraocular muscles are intact. The visual fields are full to confrontational testing. The speech is fluent and clear.  He is mildly hypophonic.  Soft palate rises symmetrically and there is no tongue deviation. Hearing is intact to conversational tone. Sensation: Sensation is intact to light touch throughout. Motor: Strength is 5/5 in the bilateral upper and lower extremities.   Shoulder shrug is equal and symmetric.  There is no pronator drift.   Movement examination: Tone: There is mild increased tone in the left lower extremity.  Otherwise, tone in the left upper, right upper and right lower extremity were normal.  Abnormal movements: None Coordination:  Patient has mild decremation with toe taps on the left.  Otherwise, rapid alternating  movements were normal. Gait and Station: The patient has significant difficulty arising out of a deep-seated chair without the use of the hands.  He actually required the examiner to pull him out of the chair and then had start hesitation.  He also hesitated within the doorway, but once he got out of the doorway, he walked with much better stride length.    ASSESSMENT/PLAN:  1.  Idiopathic, akinetic rigid Parkinson's disease, diagnosed in 2005 with symptoms dating back perhaps to 2003  -The patient's Parkinson's disease is now complicated by freezing/motor fluctuations and dyskinesia.    -  He has just changed to what appears to be Requip XL (he is getting this at the [facility] and it does not say Requip XL, but I think it is their version of the extended release form), 12 mg at night.  He has not been able to tolerate daytime dosages.  -He has just added carbidopa/levodopa 50/200 at night.  -I would continue on Stalevo 200 mg at 6am/8:30am/11am/1:30 and 4pm.  He will add an additional Stalevo 200 at 6:30 PM.  -He used to be on amantadine but is off of it now.  It appears that perhaps it was discontinued when he went to the [facility].  I don't think he had side effects.  It may be an option in the future for the treatment of dyskinesia, although I saw none today.  However, he does describe it.  -I think he may be a candidate for Apokyn or even to add some carbidopa/levodopa 25/100 IR that he could chew or dissolve when he freezes.  We talked about this in detail today, but decided to hold off to see how he does with the above changes.  -We had a lengthy discussion again about DBS therapy.  We talked about what  it would help and what it would not help with.  He has had a similar conversation with [name] at the [facility] in [place].  He may wish to pursue surgery through there because of cost.  -I would recommend an MRI of the brain, primarily because the patient walks with somewhat of a spastic gait.   He has no neck pain, so did not recommend an MRI of the cervical spine.  Again, he wishes to pursue this through the [facility] and his neurologist there. 2.  Sialorrhea  -Talked about the value of Myobloc.  He is not interested right now, but will let me know if this symptom gets worse. 3.  Dysphagia.  -I recommend a modified barium swallow examination.  He is going to see if he can get this done through the [facility], as expense is a big issue. 4.  Constipation  -This is very common with Parkinson's disease.  The patient was given a copy of the rancho recipe. 5.  Obstructive sleep apnea syndrome  -The patient is having difficulty tolerating CPAP, but was just started on it about 3 months ago.  I encouraged him to continue to try to use CPAP.  We talked about morbidity and mortality associated with untreated sleep apnea 6.  Follow-up in the next few months, sooner should new neurologic issues arise.  Much greater than 50% of this 40 minute visit was in counseling and coordinating care, as above.

## 2015-02-03 ENCOUNTER — Telehealth: Payer: Self-pay | Admitting: Neurology

## 2015-02-03 MED ORDER — CARBIDOPA-LEVODOPA 25-100 MG PO TABS: 1.0000 | ORAL_TABLET | Freq: Two times a day (BID) | ORAL | Status: DC

## 2015-02-03 NOTE — Telephone Encounter (Signed)
Spoke with patient and he states having increased episodes of freezing up to 5 times daily. Has noticed no difference in his symptoms since his appt 3 days ago. He was advised we can send in RX for IR Carbidopa Levodopa 25/100 to take twice daily if needed. May need to be chewed up or dissolved. He was advised that he should also see Dr Jerold CoombeBurton Scott back at the [facility] [place] to discuss DBS surgery. They expressed understanding and RX sent to pharmacy.

## 2015-02-03 NOTE — Telephone Encounter (Signed)
Pt called wanting to speak to a nurse regarding his current condition. Pt states that his parkinson's is getting worse. Please call pt # [phone]

## 2015-02-06 ENCOUNTER — Telehealth: Payer: Self-pay | Admitting: *Deleted

## 2015-02-06 DIAGNOSIS — G20 Parkinson's disease: Secondary | ICD-10-CM

## 2015-02-06 NOTE — Telephone Encounter (Signed)
Patient called with questions about having PT at home, a rolling walker, and his medication not working please give patient a call ASAP Copper Katrinka BlazingSmith [phone]

## 2015-02-06 NOTE — Telephone Encounter (Signed)
Will try to see if is an apokyn candidate but need to find area rep.

## 2015-02-06 NOTE — Telephone Encounter (Signed)
Spoke with patient and girlfriend. He would like a prescription for a rolling walker. He would also like to start at home physical therapy. [facility] used [facility] his [facility], but made aware that we usually use [facility] because they have a specific Parkinson's Program. They are okay with this. Okay to send referral? He also states that the addition of the Levodopa has not helped much. He is dissolving it, but it still takes 30-90 minutes to kick in and lasts anywhere from 30 minutes - 2 hours. Please advise.

## 2015-02-06 NOTE — Telephone Encounter (Signed)
LMOM for them to call back.

## 2015-02-07 NOTE — Telephone Encounter (Signed)
[facility] for [facility] PT faxed to [phone] with confirmation received. They will contact patient. Message sent to try to track down our local Apokyn rep. Awaiting response.

## 2015-02-07 NOTE — Addendum Note (Signed)
Addended bySilvio Pate: MCCRACKEN, JADE L on: 02/07/2015 10:47 AM   Modules accepted: Orders

## 2015-02-08 ENCOUNTER — Emergency Department (HOSPITAL_COMMUNITY): Payer: PPO

## 2015-02-08 ENCOUNTER — Emergency Department (HOSPITAL_COMMUNITY)
Admission: EM | Admit: 2015-02-08 | Discharge: 2015-02-09 | Disposition: A | Discharge status: Home / Self Care | Payer: PPO | Attending: Emergency Medicine | Admitting: Emergency Medicine

## 2015-02-08 ENCOUNTER — Encounter (HOSPITAL_COMMUNITY): Payer: Self-pay | Admitting: Emergency Medicine

## 2015-02-08 DIAGNOSIS — Z880 Allergy status to penicillin: Secondary | ICD-10-CM | POA: Insufficient documentation

## 2015-02-08 DIAGNOSIS — I10 Essential (primary) hypertension: Secondary | ICD-10-CM | POA: Diagnosis not present

## 2015-02-08 DIAGNOSIS — Z87442 Personal history of urinary calculi: Secondary | ICD-10-CM | POA: Diagnosis not present

## 2015-02-08 DIAGNOSIS — R05 Cough: Secondary | ICD-10-CM | POA: Diagnosis not present

## 2015-02-08 DIAGNOSIS — Z8776 Personal history of (corrected) congenital malformations of integument, limbs and musculoskeletal system: Secondary | ICD-10-CM | POA: Insufficient documentation

## 2015-02-08 DIAGNOSIS — G20 Parkinson's disease: Secondary | ICD-10-CM

## 2015-02-08 DIAGNOSIS — R251 Tremor, unspecified: Secondary | ICD-10-CM | POA: Diagnosis present

## 2015-02-08 DIAGNOSIS — E119 Type 2 diabetes mellitus without complications: Secondary | ICD-10-CM | POA: Insufficient documentation

## 2015-02-08 DIAGNOSIS — Z79899 Other long term (current) drug therapy: Secondary | ICD-10-CM | POA: Diagnosis not present

## 2015-02-08 DIAGNOSIS — R059 Cough, unspecified: Secondary | ICD-10-CM

## 2015-02-08 LAB — COMPREHENSIVE METABOLIC PANEL
ALBUMIN: 4.5 g/dL (ref 3.5–5.2)
ALK PHOS: 53 U/L (ref 39–117)
ALT: 7 U/L (ref 0–53)
AST: 39 U/L — ABNORMAL HIGH (ref 0–37)
Anion gap: 9 (ref 5–15)
BUN: 32 mg/dL — AB (ref 6–23)
CHLORIDE: 102 mmol/L (ref 96–112)
CO2: 22 mmol/L (ref 19–32)
Calcium: 9 mg/dL (ref 8.4–10.5)
Creatinine, Ser: 1.17 mg/dL (ref 0.50–1.35)
GFR calc Af Amer: 72 mL/min — ABNORMAL LOW (ref >90)
GFR, EST NON AFRICAN AMERICAN: 62 mL/min — AB (ref >90)
Glucose, Bld: 158 mg/dL — ABNORMAL HIGH (ref 70–99)
POTASSIUM: 4.2 mmol/L (ref 3.5–5.1)
Sodium: 133 mmol/L — ABNORMAL LOW (ref 135–145)
Total Bilirubin: 0.8 mg/dL (ref 0.3–1.2)
Total Protein: 7.7 g/dL (ref 6.0–8.3)

## 2015-02-08 LAB — URINALYSIS, ROUTINE W REFLEX MICROSCOPIC
BILIRUBIN URINE: NEGATIVE
Glucose, UA: NEGATIVE mg/dL
Hgb urine dipstick: NEGATIVE
KETONES UR: NEGATIVE mg/dL
NITRITE: NEGATIVE
Protein, ur: NEGATIVE mg/dL
SPECIFIC GRAVITY, URINE: 1.026 (ref 1.005–1.030)
Urobilinogen, UA: 0.2 mg/dL (ref 0.0–1.0)
pH: 5.5 (ref 5.0–8.0)

## 2015-02-08 LAB — CBC WITH DIFFERENTIAL/PLATELET
BASOS ABS: 0 10*3/uL (ref 0.0–0.1)
BASOS PCT: 0 % (ref 0–1)
Eosinophils Absolute: 0.1 10*3/uL (ref 0.0–0.7)
Eosinophils Relative: 1 % (ref 0–5)
HCT: 41.7 % (ref 39.0–52.0)
Hemoglobin: 13.8 g/dL (ref 13.0–17.0)
Lymphocytes Relative: 23 % (ref 12–46)
Lymphs Abs: 1.4 10*3/uL (ref 0.7–4.0)
MCH: 28.8 pg (ref 26.0–34.0)
MCHC: 33.1 g/dL (ref 30.0–36.0)
MCV: 87.1 fL (ref 78.0–100.0)
MONOS PCT: 7 % (ref 3–12)
Monocytes Absolute: 0.4 10*3/uL (ref 0.1–1.0)
NEUTROS PCT: 69 % (ref 43–77)
Neutro Abs: 4 10*3/uL (ref 1.7–7.7)
Platelets: 212 10*3/uL (ref 150–400)
RBC: 4.79 MIL/uL (ref 4.22–5.81)
RDW: 13.2 % (ref 11.5–15.5)
WBC: 5.9 10*3/uL (ref 4.0–10.5)

## 2015-02-08 LAB — URINE MICROSCOPIC-ADD ON

## 2015-02-08 MED ORDER — ROPINIROLE HCL ER 2 MG PO TB24
12.0000 mg | ORAL_TABLET | Freq: Every day | ORAL | Status: DC
  Administered 2015-02-08: 12 mg via ORAL
  Filled 2015-02-08 (×2): qty 6

## 2015-02-08 MED ORDER — LISINOPRIL 10 MG PO TABS
10.0000 mg | ORAL_TABLET | Freq: Every day | ORAL | Status: DC
  Administered 2015-02-08 – 2015-02-09 (×2): 10 mg via ORAL
  Filled 2015-02-08 (×2): qty 1

## 2015-02-08 MED ORDER — SERTRALINE HCL 50 MG PO TABS
100.0000 mg | ORAL_TABLET | Freq: Every day | ORAL | Status: DC
  Administered 2015-02-08 – 2015-02-09 (×2): 100 mg via ORAL
  Filled 2015-02-08 (×2): qty 2

## 2015-02-08 MED ORDER — HYDROCHLOROTHIAZIDE 12.5 MG PO CAPS
12.5000 mg | ORAL_CAPSULE | Freq: Every day | ORAL | Status: DC
  Administered 2015-02-08 – 2015-02-09 (×2): 12.5 mg via ORAL
  Filled 2015-02-08 (×2): qty 1

## 2015-02-08 MED ORDER — CARBIDOPA-LEVODOPA 25-100 MG PO TABS
1.0000 | ORAL_TABLET | ORAL | Status: AC
  Administered 2015-02-08: 1 via ORAL
  Filled 2015-02-08: qty 1

## 2015-02-08 MED ORDER — CARBIDOPA-LEVODOPA 25-100 MG PO TABS
1.0000 | ORAL_TABLET | ORAL | Status: AC
Start: 2015-02-08 — End: 2015-02-08
  Administered 2015-02-08: 1 via ORAL
  Filled 2015-02-08: qty 1

## 2015-02-08 MED ORDER — CARBIDOPA-LEVODOPA 25-100 MG PO TABS
1.0000 | ORAL_TABLET | Freq: Three times a day (TID) | ORAL | Status: DC | PRN
Start: 2015-02-08 — End: 2015-02-09
  Administered 2015-02-08 – 2015-02-09 (×2): 1 via ORAL
  Filled 2015-02-08 (×3): qty 1

## 2015-02-08 MED ORDER — GLIPIZIDE 5 MG PO TABS
5.0000 mg | ORAL_TABLET | Freq: Two times a day (BID) | ORAL | Status: DC
  Administered 2015-02-08 – 2015-02-09 (×2): 5 mg via ORAL
  Filled 2015-02-08 (×4): qty 1

## 2015-02-08 MED ORDER — METFORMIN HCL 500 MG PO TABS
1000.0000 mg | ORAL_TABLET | Freq: Two times a day (BID) | ORAL | Status: DC
  Administered 2015-02-08 – 2015-02-09 (×2): 1000 mg via ORAL
  Filled 2015-02-08 (×4): qty 2

## 2015-02-08 MED ORDER — CLINDAMYCIN HCL 150 MG PO CAPS
150.0000 mg | ORAL_CAPSULE | Freq: Two times a day (BID) | ORAL | Status: DC
  Administered 2015-02-08 – 2015-02-09 (×2): 150 mg via ORAL
  Filled 2015-02-08 (×3): qty 1

## 2015-02-08 MED ORDER — CARBIDOPA-LEVODOPA ER 50-200 MG PO TBCR
1.0000 | EXTENDED_RELEASE_TABLET | Freq: Every day | ORAL | Status: DC
  Administered 2015-02-08 – 2015-02-09 (×5): 1 via ORAL
  Filled 2015-02-08 (×10): qty 1

## 2015-02-08 MED ORDER — LISINOPRIL-HYDROCHLOROTHIAZIDE 10-12.5 MG PO TABS: 1.0000 | ORAL_TABLET | Freq: Every day | ORAL | Status: DC

## 2015-02-08 NOTE — ED Notes (Signed)
MD at bedside. [facility]

## 2015-02-08 NOTE — ED Notes (Signed)
Pt states that he is supposed to be wearing a CPAP at night.  States that he is only wearing it about a couple hours a night due to claustrophobia.

## 2015-02-08 NOTE — ED Provider Notes (Signed)
CSN: [id number]     Arrival date & time 02/08/15  1003 History   First MD Initiated Contact with Patient 02/08/15 1004     Chief Complaint  Patient presents with  . Tremors  . Weakness     (Consider location/radiation/quality/duration/timing/severity/associated sxs/prior Treatment) Patient is a 69 y.o. male presenting with weakness. The history is provided by the patient.  Weakness This is a chronic problem. The current episode started 6 to 12 hours ago. Episode frequency: intermittent. The problem has not changed since onset.Pertinent negatives include no chest pain, no abdominal pain, no headaches and no shortness of breath. Nothing aggravates the symptoms. Nothing relieves the symptoms. Treatments tried: sinemet, stalevo. The treatment provided moderate relief.    Past Medical History  Diagnosis Date  . Back pain     lumbar  . Urgency of urination   . Hammer toe   . Diabetes mellitus   . Unspecified essential hypertension   . Parkinson disease   . Renal stone    Past Surgical History  Procedure Laterality Date  . Toe surgery Right     big toe  . Toe surgery Left     2nd toe  . Cystoscopy/retrograde/ureteroscopy Right 08/22/2014    Procedure: Cystoscopy, Right Retrograde Pyelogram, Right Ureteral Stent Placement;  Surgeon: Garnett Farm, MD;  Location: [facility];  Service: Urology;  Laterality: Right;   Family History  Problem Relation Age of Onset  . High blood pressure Mother   . Diabetes Mother   . Stroke Father    History  Substance Use Topics  . Smoking status: Never Smoker   . Smokeless tobacco: Not on file  . Alcohol Use: No    Review of Systems  Constitutional: Negative for fever.  HENT: Negative for drooling and rhinorrhea.   Eyes: Negative for pain.  Respiratory: Positive for cough (mild). Negative for shortness of breath.   Cardiovascular: Negative for chest pain and leg swelling.  Gastrointestinal: Negative for nausea, vomiting, abdominal pain and  diarrhea.  Genitourinary: Negative for dysuria and hematuria.  Musculoskeletal: Negative for gait problem and neck pain.  Skin: Negative for color change.  Neurological: Positive for weakness. Negative for numbness and headaches.  Hematological: Negative for adenopathy.  Psychiatric/Behavioral: Negative for behavioral problems.  All other systems reviewed and are negative.     Allergies  Amoxapine and related; Amoxicillin; Ceftriaxone; and Imipenem  Home Medications   Prior to Admission medications   Medication Sig Start Date End Date Taking? Authorizing Provider  carbidopa-levodopa (SINEMET CR) 50-200 MG per tablet Take 1 tablet by mouth at bedtime. 01/11/15   Octaviano Batty Tat, DO  carbidopa-levodopa (SINEMET IR) 25-100 MG per tablet Take 1 tablet by mouth 2 (two) times daily. 02/03/15   Rebecca S Tat, DO  carbidopa-levodopa-entacapone (STALEVO) 50-200-200 MG per tablet Take 1 tablet by mouth 5 (five) times daily.  01/22/13   Levert Feinstein, MD  Cholecalciferol (VITAMIN D3) 3000 UNITS TABS Take 1 tablet by mouth daily.    Historical Provider, MD  glipiZIDE (GLUCOTROL) 5 MG tablet Take 5 mg by mouth 2 (two) times daily before a meal.    Historical Provider, MD  guaifenesin (HUMIBID E) 400 MG TABS tablet Take 400 mg by mouth every 4 (four) hours as needed.    Historical Provider, MD  lisinopril-hydrochlorothiazide (PRINZIDE,ZESTORETIC) 10-12.5 MG per tablet Take 1 tablet by mouth daily.    Historical Provider, MD  rOPINIRole (REQUIP) 3 MG tablet Take 3 mg by mouth 4 (four) times daily.  Historical Provider, MD  Ropinirole HCl 12 MG TB24 Take 1 tablet (12 mg total) by mouth daily. 01/11/15   Octaviano Battyebecca S Tat, DO  sertraline (ZOLOFT) 100 MG tablet Take 100 mg by mouth daily.     Historical Provider, MD   BP 102/59 mmHg  Pulse 78  Temp(Src) 97.7 F (36.5 C) (Oral)  Resp 16  SpO2 93% Physical Exam  Constitutional: He is oriented to person, place, and time. He appears well-developed and  well-nourished.  HENT:  Head: Normocephalic and atraumatic.  Right Ear: External ear normal.  Left Ear: External ear normal.  Nose: Nose normal.  Mouth/Throat: Oropharynx is clear and moist. No oropharyngeal exudate.  Eyes: Conjunctivae and EOM are normal. Pupils are equal, round, and reactive to light.  Neck: Normal range of motion. Neck supple.  Cardiovascular: Normal rate, regular rhythm, normal heart sounds and intact distal pulses.  Exam reveals no gallop and no friction rub.   No murmur heard. Pulmonary/Chest: Effort normal and breath sounds normal. No respiratory distress. He has no wheezes.  Abdominal: Soft. Bowel sounds are normal. He exhibits no distension. There is no tenderness. There is no rebound and no guarding.  Musculoskeletal: Normal range of motion. He exhibits no edema or tenderness.  Neurological: He is alert and oriented to person, place, and time.  alert, oriented x3 speech: normal in context and clarity memory: intact grossly cranial nerves II-XII: intact motor strength: 5/5 in UE's. 5/5 in LLE. 4/5 in RLE.  sensation: intact to light touch diffusely  cerebellar: finger-to-nose intact gait: unable to ambulate  Skin: Skin is warm and dry.  Psychiatric: He has a normal mood and affect. His behavior is normal.  Nursing note and vitals reviewed.   ED Course  Procedures (including critical care time) Labs Review Labs Reviewed  COMPREHENSIVE METABOLIC PANEL - Abnormal; Notable for the following:    Sodium 133 (*)    Glucose, Bld 158 (*)    BUN 32 (*)    AST 39 (*)    GFR calc non Af Amer 62 (*)    GFR calc Af Amer 72 (*)    All other components within normal limits  URINALYSIS, ROUTINE W REFLEX MICROSCOPIC - Abnormal; Notable for the following:    Color, Urine AMBER (*)    Leukocytes, UA TRACE (*)    All other components within normal limits  URINE CULTURE  CBC WITH DIFFERENTIAL/PLATELET  URINE MICROSCOPIC-ADD ON    Imaging Review Dg Chest 2  View  02/08/2015   CLINICAL DATA:  Cough.  Parkinson's disease.  EXAM: CHEST  2 VIEW  COMPARISON:  Chest x-rays dated 08/23/2014 and 08/20/2014 and chest CT dated 10/28/2014  FINDINGS: Heart size and pulmonary vascularity are normal. The lungs are clear. No effusions. No significant osseous abnormality. Chronic slight elevation of the right hemidiaphragm.  IMPRESSION: No active cardiopulmonary disease.   Electronically Signed   By: Francene BoyersJames  Maxwell M.D.   On: 02/08/2015 11:29     EKG Interpretation   Date/Time:  Wednesday February 08 2015 10:43:39 EDT Ventricular Rate:  77 PR Interval:  195 QRS Duration: 111 QT Interval:  386 QTC Calculation: 437 R Axis:   -38 Text Interpretation:  Sinus rhythm Ventricular premature complex Left  ventricular hypertrophy Anterior infarct, old Baseline wander in lead(s)  V1 V2 V3 Otherwise no significant change Confirmed by Karinna Beadles  MD,  Darshay Deupree ([id number]) on 02/08/2015 11:58:40 AM      MDM   Final diagnoses:  Cough  Parkinson's disease  10:42 AM 69 y.o. male w hx of Idiopathic, akinetic rigid Parkinson's disease, DM who presents with episodes of his muscles freezing up. He has a long history of this and follows with Dr. Arbutus Leas with [place] [facility]. He states that he takes carbidopa levodopa up to 2 times daily when necessary for freezing episodes. He took one at 2 AM this morning and a Stalevo at 6 AM. He states that he had resolution but his freezing up came back. He is afebrile and vital signs are unremarkable here. He appears well on exam. He is able to move all his extremities. He does complain that his lower extremity still heavy. I discussed the case with Dr. Arbutus Leas who is in the process of getting authorization for him to start apokyn injections. We'll give him a dose of immediate release carbidopa levodopa here. We'll get screening labs and imaging to rule out other issues.  3:29 PM I interpreted/reviewed the labs and/or imaging which were non-contributory.   Pt unable to ambulate. Care mgmt and SW have seen the pt and recommend placement from ED. He will need PT eval which is ordered. I already discussed the case with his neurologist who had no further recommendations at this time. Dr. Arbutus Leas states he can take the sinemet immediate release up to tid or qid if needed. He is currently taking it bid prn muscle freezing. I believe his sx are chronic and related to slow worsening of his parkinson's.   Pt wears cpap at night, this is ordered. Has sometimes had borderline O2 sats in ED. Has not required oxygen continuously.    Purvis Sheffield, MD 02/08/15 1549

## 2015-02-08 NOTE — Progress Notes (Addendum)
EDCM spoke to patient's daughter Alvis LemmingsDawn and Lilia ProJason Canoy at bedside.  Patient's family informed EDCM that patient's friend [name] is just a neighbor and has no legal obligation/authority over patient.  They would prefer if we contact them to discuss care/ placement.  EDSW updated patient's family with placement plan.

## 2015-02-08 NOTE — ED Notes (Signed)
Pt. Is unable to use the restroom at this time, but is aware that we need a urine specimen.  

## 2015-02-08 NOTE — Progress Notes (Signed)
Pt stated that he will notify RN to call RT when he is ready for his CPAP.  RT to monitor and assess as needed.

## 2015-02-08 NOTE — Progress Notes (Signed)
CSW completed FL2 for pt and sent out message to surrounding SNF to try and obtain placement.  Trish MageBrittney Lacresia Darwish, LCSWA [phone] ED CSW 02/08/2015 11:46 PM

## 2015-02-08 NOTE — ED Notes (Signed)
Bed: WA26 Expected date:  Expected time:  Means of arrival:  Comments: 

## 2015-02-08 NOTE — ED Notes (Signed)
DAUGHTER DAWN REQUESTING TO SPEAK TO AMY CASE MANAGER

## 2015-02-08 NOTE — Progress Notes (Addendum)
ED CM consulted by EDP for assist with home health services.    CM reviewed EPIC CM left a voice message for Cody Cody [name], rep of [facility] after noting Cody Cody [name] Neurology office referral to Cody Cody [name] parkinson program Pending return call back Cm assessed Cody [name] and discussed Home health and PDN CM reviewed in details medicare guidelines, home health [facility](HH) (length of stay in home, [facility] available, coverage, primary caregiver, up to 24 hrs before services may be started), Private duty nursing (PDN-coverage, length of stay in the home types of staff available), assisted living (ASL- coverage, services offered)  CM reviewed availability of HH SW to assist pcp to get Cody [name] to snf (if desired disposition) from the community level. CM provided family with a list of [place] home health agencies, PDN Discussed the active [facility] for [facility] Cody [name] discussed his previous gym memberships but not sure if at this time either may be what he needs Cody [name] and Dr Cody Cody [name] Neurologist has an upcoming appt in June 2016 1405 Cody [name] and Cm spoke via phone with Cody Cody [name] Cody [name] personal friend and she plans to have surgery Feb 20 2015 reports Cody [name] doing paperwork to get into [facility] Reports Cody [name] does not have extra time or availability at this time to be Cody [name]'s caregiver either With Cody Cody [name] on the phone she and the Cody [name] admits Cody [name] has had more frequent "locking up episodes recently and it would be better if he was in a facility for a short time.  ED CM updated ED SW and ED EDP 1425 ED Cm, ED SW and EDP spoke about options for d/c for Cody [name] Cm informed EDP of conversation with Cody SeltzerCooper stating Cody [name] has had more episodes, no home caregiver, about Cody [name] working on 2 snfs. 1453 CM left a voice message for Cody Cody [name] of [facility] after entered Cody [name] and eval order after Cody [name] inquired if Cody [name] could evaluate him  1455 Cody [name] and CM left a voice message for Cody [name] at Cody Cody [name] (Cody [name])at  [phone] Cody [name] wants updated on facilities and advice on disposition Pending return call Assisted Cody [name] to use urinal Cody [name] unable to place urinal, not able to put penis in urinal, cm provided total care with bladder elimination  1521 voice message left for Cody Cody [name] 1536 Cm and Cody [name] spoke with Cody Cody [name] via phone who confirms she can not be primary caregiver for Cody [name] and reports Cody [name] with increased episode of inability to move extremities. Cody Cody [name] states she has [facility] and is interested in Cody [name] going there Other facility interests are [facility] and [facility].  Cm explained to her with Cody [name] listening that a county bed search is completed via a FL2 but cm would share facility preferences with ED SW.  Cody Cody [name] states she will visit after 1800. CM reviewed HH, PDN, ALF and SNF with Cody Cody [name]   Cody Cody [name] states she and Cody [name] had already discussed this week the preference of going to a facility vs home health.  1620 Cm called [facility] x [phone] and requested Cody [name] be put on schedule to be seen in am

## 2015-02-08 NOTE — ED Notes (Signed)
Patient transported to X-ray 

## 2015-02-08 NOTE — ED Provider Notes (Signed)
Plan is for patient to stay in the ED in order to get directly placed into a nursing home. Home meds and diet have been ordered.  Linwood DibblesJon Kailan Carmen, MD 02/08/15 1539

## 2015-02-08 NOTE — ED Notes (Signed)
Patient states he is dehydrated and unable to urinate like normal.

## 2015-02-08 NOTE — Progress Notes (Signed)
CSW consulted by RN CM to discuss pt disposition needs. Per RN CM, regarding conversation with pt and pt girlfriend patient has difficulty moving and has had more "Locking up" episodes. Per discussion with RN CM, and EDP, patient may benefit from skilled nursing placement and/or home health services in order to obtain further assistance. CSW awaiting further medical evaluation to determine disposition needs. Per RN, patient daughter has been looking into [name] and [facility].   Olga CoasterKristen Sheresa Cullop, LCSW  Clinical Social Work  [facility] [phone]

## 2015-02-08 NOTE — ED Notes (Signed)
Per EMS: [facility] from the [facility] (independent[facility] facility.  Pt has been dx w/ parkinson disease x 10 years that has been progressing to this point.  Pt c/o intermittent all extremity paralysis x "a long time" but has been increasing x 1 month.  States he has 4-5 episodes of his legs and arms not being able to move per day.  States that his meds are in the process of being adjusted to alleviate this problem.  Has been having this particular episode of extremity paralysis x 2 hrs.

## 2015-02-08 NOTE — ED Notes (Signed)
MD at bedside. HARRISON PRESENT

## 2015-02-09 LAB — TROPONIN I

## 2015-02-09 LAB — CBG MONITORING, ED: GLUCOSE-CAPILLARY: 163 mg/dL — AB (ref 70–99)

## 2015-02-09 NOTE — ED Notes (Signed)
Pt states he cannot move arms. However he then moves arms independently. Pt turned on L side.

## 2015-02-09 NOTE — Progress Notes (Signed)
CSW working on placement for patient to snf. CSW awaiting pt evaluation this morning.   Olga CoasterKristen Janesa Dockery, LCSW  Clinical Social Work  [facility] [phone]

## 2015-02-09 NOTE — ED Provider Notes (Signed)
Pt accepted to [facility].   1. Parkinson's disease   2. Cough      Toy CookeyMegan Docherty, MD 02/09/15 [phone]

## 2015-02-09 NOTE — ED Notes (Signed)
Friend visiting at the bedside.

## 2015-02-09 NOTE — Progress Notes (Signed)
Pt has refused CPAP for the night per RN.  RT to monitor and assess as needed.

## 2015-02-09 NOTE — Progress Notes (Signed)
CSW received authorization [id number] for Snf. Patient has been accepted to [name]. Pt can be transferred once paperwork completed by patient daughter.   Olga CoasterKristen Wasyl Dornfeld, LCSW  Clinical Social Work  [facility] [phone]

## 2015-02-09 NOTE — Progress Notes (Signed)
Pt has been accepted to [name]. Pt insurance is reviewing information for authorization. Pt anticipated to have bed available and insurance authorization by noon.   Olga CoasterKristen Malissie Musgrave, LCSW  Clinical Social Work  [facility] [phone]

## 2015-02-09 NOTE — ED Notes (Signed)
Family at bedside. 

## 2015-02-09 NOTE — ED Notes (Signed)
Physical therapy at bedside assisting pt with movement/evaluation.

## 2015-02-09 NOTE — Discharge Instructions (Signed)
Parkinson Disease Parkinson disease is a disorder of the central nervous system, which includes the brain and spinal cord. A person with this disease slowly loses the ability to completely control body movements. Within the brain, there is a group of nerve cells (basal ganglia) that help control movement. The basal ganglia are damaged and do not work properly in a person with Parkinson disease. In addition, the basal ganglia produce and use a brain chemical called dopamine. The dopamine chemical sends messages to other parts of the body to control and coordinate body movements. Dopamine levels are low in a person with Parkinson disease. If the dopamine levels are low, then the body does not receive the correct messages it needs to move normally.  CAUSES  The exact reason why the basal ganglia get damaged is not known. Some medical researchers have thought that infection, genes, environment, and certain medicines may contribute to the cause.  SYMPTOMS   An early symptom of Parkinson disease is often an uncontrolled shaking (tremor) of the hands. The tremor will often disappear when the affected hand is consciously used.  As the disease progresses, walking, talking, getting out of a chair, and new movements become more difficult.  Muscles get stiff and movements become slower.  Balance and coordination become harder.  Depression, trouble swallowing, urinary problems, constipation, and sleep problems can occur.  Later in the disease, memory and thought processes may deteriorate. DIAGNOSIS  There are no specific tests to diagnose Parkinson disease. You may be referred to a neurologist for evaluation. Your caregiver will ask about your medical history, symptoms, and perform a physical exam. Blood tests and imaging tests of your brain may be performed to rule out other diseases. The imaging tests may include an MRI or a CT scan. TREATMENT  The goal of treatment is to relieve symptoms. Medicines may be  prescribed once the symptoms become troublesome. Medicine will not stop the progression of the disease, but medicine can make movement and balance better and help control tremors. Speech and occupational therapy may also be prescribed. Sometimes, surgical treatment of the brain can be done in young people. HOME CARE INSTRUCTIONS  Get regular exercise and rest periods during the day to help prevent exhaustion and depression.  If getting dressed becomes difficult, replace buttons and zippers with Velcro and elastic on your clothing.  Take all medicine as directed by your caregiver.  Install grab bars or railings in your home to prevent falls.  Go to speech or occupational therapy as directed.  Keep all follow-up visits as directed by your caregiver. SEEK MEDICAL CARE IF:  Your symptoms are not controlled with your medicine.  You fall.  You have trouble swallowing or choke on your food. MAKE SURE YOU:  Understand these instructions.  Will watch your condition.  Will get help right away if you are not doing well or get worse. Document Released: 10/04/2000 Document Revised: 02/01/2013 Document Reviewed: 11/06/2011 ExitCare Patient Information 2015 [employer]. This information is not intended to replace advice given to you by your health care provider. Make sure you discuss any questions you have with your health care provider.  

## 2015-02-09 NOTE — Progress Notes (Addendum)
[facility] with authorization number [id number]. Pt to be transported by ptar at 2pm.  RN can call report to [phone]. Pt [facility] after work.    Olga CoasterKristen Satin Boal, LCSW  Clinical Social Work  [facility] [phone]

## 2015-02-09 NOTE — Progress Notes (Signed)
02/09/15 0924 PHYSICAL THERAPY EVALUATION    Last PT Received On 02/09/15  Assistance Needed +2  History of Present Illness admitted 02/08/15 with worseneing Parkinson's dyskinesia, unable to move extremeties/ambulate.  Precautions  Precautions Fall  Home Living  Family/patient expects to be discharged to: Skilled nursing facility  Prior Function  Level of Independence Independent with assistive device(s)  Comments paient states he was driving last week, able to care for self until yesterday.  Communication  Communication No difficulties  Pain Assessment  Pain Assessment Faces  Faces Pain Scale 6  Pain Location hips from lying in the bed.  Pain Descriptors / Indicators Discomfort  Pain Intervention(s) Repositioned  Cognition  Arousal/Alertness Awake/alert  Behavior During Therapy WFL for tasks assessed/performed;Flat affect  Overall Cognitive Status Within Functional Limits for tasks assessed  Upper Extremity Assessment  Upper Extremity Assessment RUE deficits/detail  RUE Deficits / Details decreased initiation of extension of elbow, and long finger extension, able to raise arm above head  RUE Coordination decreased fine motor;decreased gross motor  LUE Deficits / Details similar ro R  LUE Coordination decreased fine motor;decreased gross motor  Lower Extremity Assessment  Lower Extremity Assessment RLE deficits/detail;LLE deficits/detail  RLE Deficits / Details decreased reciprocal movements, slow to initiate  LLE Deficits / Details same as R  Cervical / Trunk Assessment  Cervical / Trunk Assessment Other exceptions  Cervical / Trunk Exceptions decreased rotation, flexion and extension neck and trunk  Bed Mobility  Overal bed mobility +2 for physical assistance;+ 2 for safety/equipment  General bed mobility comments total assist to move  legs to edge and to  assist trunk into upright position.  Transfers  Overall transfer level Needs assistance  Equipment used Rolling  walker (2 wheeled)  Transfers Sit to/from [employer] Transfers  Sit to Stand Max assist;+2 physical assistance;+2 safety/equipment  Stand pivot transfers Max assist;+2 physical assistance;+2 safety/equipment  General transfer comment lifting assist from bed at RW, steady assist  to stabilize  when standing. leans posteriorly, extra time and manual weight shifting to facilitate stepping to pivot to recliner, small base, decreased ability to step   Balance  Overall balance assessment Needs assistance;History of Falls  Sitting-balance support Bilateral upper extremity supported;Feet supported  Sitting balance-Leahy Scale Poor  Postural control Posterior lean  Standing balance support Bilateral upper extremity supported;During functional activity  Standing balance-Leahy Scale Poor  Standing balance comment posterior lean  PT - End of Session  Equipment Utilized During Treatment Gait belt  Activity Tolerance Patient tolerated treatment well  Patient left in chair;with call bell/phone within reach  Nurse Communication Mobility status  PT Assessment  PT Therapy Diagnosis  Difficulty walking;Abnormality of gait;Generalized weakness  PT Recommendation/Assessment Patient needs continued PT services  PT Problem List Decreased strength;Decreased range of motion;Decreased activity tolerance;Decreased balance;Decreased mobility;Decreased coordination;Decreased knowledge of use of DME;Decreased safety awareness;Decreased knowledge of precautions;Impaired tone  Barriers to Discharge Decreased caregiver support  PT Plan  PT Frequency (ACUTE ONLY) Min 3X/week  PT Treatment/Interventions (ACUTE ONLY) DME instruction;Gait training;Functional mobility training;Therapeutic activities;Therapeutic exercise;Balance training;Neuromuscular re-education;Patient/family education  PT Recommendation  Follow Up Recommendations SNF;Supervision/Assistance - 24 hour  PT equipment None recommended by PT   Individuals Consulted  Consulted and Agree with Results and Recommendations Patient  Acute Rehab PT Goals  Patient Stated Goal to get my movement back  PT Goal Formulation With patient  Time For Goal Achievement 02/23/15  Potential to Achieve Goals Good  PT Time Calculation  PT Start Time (ACUTE ONLY) 1610  PT Stop Time (ACUTE ONLY) 0920  PT Time Calculation (min) (ACUTE ONLY) 51 min  PT G-Codes **NOT FOR INPATIENT CLASS**  Functional Assessment Tool Used clinical  judgement  Functional Limitation Mobility: Walking and moving around  Mobility: Walking and Moving Around Current Status (Z6109(G8978) CN  Mobility: Walking and Moving Around Goal Status (U0454(G8979) CI  PT General Charges  $$ ACUTE PT VISIT 1 Procedure  PT Evaluation  $Initial PT Evaluation Tier I 1 Procedure  PT Treatments  $Therapeutic Activity 23-37 mins  Written Expression  Dominant Hand Right  Blanchard KelchKaren Llewellyn Choplin PT [phone]

## 2015-02-09 NOTE — ED Notes (Signed)
Bed: [id number] Expected date:  Expected time:  Means of arrival:  Comments: Room 26

## 2015-02-09 NOTE — ED Notes (Signed)
Condom catheter applied (small) with new drainage bag.

## 2015-02-09 NOTE — ED Notes (Signed)
Pt called out to the nurse's station with complaints of chest pain. Pt currently rating pain at 3 on a scale of 0-10, but states that 15 min prior to calling,his pain was 8-9 on a scale of 1-10. Pt is unable to give descriptions of the type of pain that he is having at this time. Pt states he has not had this type of pain before. VSS. Dr. Read DriversMolpus notified of pt's complaints. Report given to Beth,RN. Pt moved to rm 25.

## 2015-02-10 ENCOUNTER — Telehealth: Payer: Self-pay | Admitting: Neurology

## 2015-02-10 LAB — URINE CULTURE
Colony Count: NO GROWTH
Culture: NO GROWTH
SPECIAL REQUESTS: NORMAL

## 2015-02-10 NOTE — Telephone Encounter (Signed)
Please call Copper Katrinka BlazingSmith back at [phone] about getting form signed for patient

## 2015-02-10 NOTE — Telephone Encounter (Signed)
Form faxed to nursing home for patient to sign with witness and fax back. Awaiting form signature.

## 2015-02-10 NOTE — Telephone Encounter (Signed)
Left message on machine for patient to call back. To make him aware I need him to come to the office to sign the Apokyn form so that I can send it off to start medication. Awaiting call back.

## 2015-02-13 ENCOUNTER — Telehealth: Payer: Self-pay | Admitting: *Deleted

## 2015-02-13 NOTE — Telephone Encounter (Signed)
Spoke with patient. Correct dosage information for patient's PD meds faxed to [facility] at [phone] with confirmation received.

## 2015-02-13 NOTE — Telephone Encounter (Signed)
Patient called to speak with you about his medications and the correct doses he states he is in a facility now and want to correct with everything  Call back number [phone]

## 2015-02-15 ENCOUNTER — Telehealth: Payer: Self-pay | Admitting: Neurology

## 2015-02-15 NOTE — Telephone Encounter (Signed)
Verified med list with Neysa BonitoChristy and it is correct. She will call with any other problems.

## 2015-02-15 NOTE — Telephone Encounter (Signed)
Please call [facility], she wants to review med list. CB# [phone] / Oneita KrasSherri S.

## 2015-02-17 ENCOUNTER — Telehealth: Payer: Self-pay | Admitting: *Deleted

## 2015-02-17 NOTE — Telephone Encounter (Signed)
Specialty pharmacy calling in reference to his Apokyn and the amount to dispense  [facility] [phone]

## 2015-02-17 NOTE — Telephone Encounter (Signed)
What dose do you want patient on?

## 2015-02-17 NOTE — Telephone Encounter (Signed)
That is not decided until a test dose is given in the home.  Has that part been done yet?

## 2015-02-17 NOTE — Telephone Encounter (Signed)
Spoke with Corrie DandyMary and she verified we wanted the dose "DAW" for his start up orders. No substitutions are available for this medication but "DAW" has to specified. Gave a verbal order and she will call with any other questions.

## 2015-02-27 ENCOUNTER — Telehealth: Payer: Self-pay | Admitting: *Deleted

## 2015-02-27 MED ORDER — TRIMETHOBENZAMIDE HCL 300 MG PO CAPS: 300.0000 mg | ORAL_CAPSULE | Freq: Three times a day (TID) | ORAL | Status: DC

## 2015-02-27 NOTE — Telephone Encounter (Signed)
Left message on machine for patient to call back.

## 2015-02-27 NOTE — Telephone Encounter (Signed)
Patient calling in reference to a new mediation that should be delivered on tomorrow. He states that Dr. Arbutus Leasat needed him to call when it got in  Call back number [phone]

## 2015-02-27 NOTE — Telephone Encounter (Signed)
Spoke with patient. Made aware he needs to be on trimethobenzamide for 3 full days prior to starting Apokyn and 2 months after. [facility] his [facility]. He will call with any questions and keep scheduled follow up.

## 2015-03-09 ENCOUNTER — Telehealth: Payer: Self-pay | Admitting: Neurology

## 2015-03-09 NOTE — Telephone Encounter (Signed)
Pt daughter Cody [name] called and wants to speak with someone about her father. He is having a lot of anxiety and they want to know if we can give him something for that please call her at [phone]

## 2015-03-09 NOTE — Telephone Encounter (Signed)
Patient's daughter made aware that we do not treat anxiety. She will speak with patient's PCP.

## 2015-03-09 NOTE — Telephone Encounter (Signed)
The pharmacy called and needs some more information on a medication we order for patient please call [phone]

## 2015-03-09 NOTE — Telephone Encounter (Signed)
Per patient's daughter's request note faxed to [facility] at [phone] with confirmation received that we do not treat anxiety or prescribe anxiety medication.

## 2015-03-09 NOTE — Telephone Encounter (Signed)
Clarified [employer] RX with pharmacist.

## 2015-03-14 ENCOUNTER — Telehealth: Payer: Self-pay | Admitting: Neurology

## 2015-03-14 NOTE — Telephone Encounter (Signed)
Order signed and faxed to [facility] at [phone] with confirmation received.

## 2015-03-14 NOTE — Telephone Encounter (Signed)
Cody [name](RN) called in regards to [name] to try and get his medicine (Aprokin) dosage increased, please call Cody [name] back at [phone]/Dawn

## 2015-03-14 NOTE — Telephone Encounter (Signed)
Spoke with Leonette Mostharles and he asked for order to be written to [facility] for patient to have Apokyn 0.3 mg. He is in the process of titrating medication.

## 2015-03-21 ENCOUNTER — Telehealth: Payer: Self-pay | Admitting: Neurology

## 2015-03-21 NOTE — Telephone Encounter (Signed)
Received call Friday night (5/27) from answering service to "clarify order."  Ans service didn't know the order so [facility]  Talked with nurse.  She said she needed to confirm dosage of Stalevo but she didn't know the dose she was giving!  She said to me "its a parkinsons medication.  He takes it 7 times a day."  I told her that I knew that but needed the dosage.  She did not have it.  Said that the pt told her what she was giving was right.  Told her not to change what she was doing and would confirm with them next week.  Will have my MA call them to make sure it is as I ordered it.

## 2015-03-21 NOTE — Telephone Encounter (Signed)
I called and no one could come to the phone. Left message for them to call back.

## 2015-03-28 ENCOUNTER — Ambulatory Visit: Payer: PPO | Admitting: Neurology

## 2015-03-28 ENCOUNTER — Encounter: Payer: Self-pay | Admitting: Neurology

## 2015-03-28 DIAGNOSIS — Z029 Encounter for administrative examinations, unspecified: Secondary | ICD-10-CM

## 2015-03-31 ENCOUNTER — Encounter: Payer: Self-pay | Admitting: Neurology

## 2015-03-31 ENCOUNTER — Ambulatory Visit (INDEPENDENT_AMBULATORY_CARE_PROVIDER_SITE_OTHER): Payer: PPO | Admitting: Neurology

## 2015-03-31 ENCOUNTER — Telehealth: Payer: Self-pay | Admitting: Neurology

## 2015-03-31 VITALS — BP 142/64 | HR 84 | Ht 70.0 in | Wt 218.0 lb

## 2015-03-31 DIAGNOSIS — G249 Dystonia, unspecified: Secondary | ICD-10-CM | POA: Diagnosis not present

## 2015-03-31 DIAGNOSIS — G20 Parkinson's disease: Secondary | ICD-10-CM

## 2015-03-31 DIAGNOSIS — F458 Other somatoform disorders: Secondary | ICD-10-CM

## 2015-03-31 DIAGNOSIS — R1319 Other dysphagia: Secondary | ICD-10-CM

## 2015-03-31 MED ORDER — CARBIDOPA-LEVODOPA 25-100 MG PO TABS: 1.0000 | ORAL_TABLET | Freq: Every day | ORAL | Status: DC | PRN

## 2015-03-31 MED ORDER — CARBIDOPA-LEVODOPA-ENTACAPONE 50-200-200 MG PO TABS: ORAL_TABLET | ORAL | Status: DC

## 2015-03-31 MED ORDER — CARBIDOPA-LEVODOPA ER 50-200 MG PO TBCR: 1.0000 | EXTENDED_RELEASE_TABLET | Freq: Every day | ORAL | Status: DC

## 2015-03-31 MED ORDER — ROPINIROLE HCL ER 12 MG PO TB24: 1.0000 | ORAL_TABLET | Freq: Every day | ORAL | Status: DC

## 2015-03-31 MED ORDER — CARBIDOPA-LEVODOPA 25-100 MG PO TABS
2.0000 | ORAL_TABLET | Freq: Once | ORAL | Status: AC
  Administered 2015-03-31: 2 via ORAL

## 2015-03-31 NOTE — Telephone Encounter (Signed)
Prescription for all medications faxed to [facility] at [phone] with confirmation received.

## 2015-03-31 NOTE — Progress Notes (Signed)
Note routed to Dr Shaune Pollack.

## 2015-03-31 NOTE — Patient Instructions (Addendum)
1. We have you scheduled for your on/off test on 04/12/2015 at 2:30 pm to arrive at 2:00 pm. Please do not take your Parkinson's medications on this date (Requip, Levodopa, Stalevo, Apokyn). Take your last dose on 04/11/2015 (the evening dose). 2. Continue Stalevo 50-200-200 - 1 tablet 7 times daily. 3. Continue Apokyn 0.3 mg - as needed up to 5 times daily. 4. Continue Requip XL 12 mg - 1 tablet daily. 5. Continue Carbidopa Levodopa 50/200 CR at night. DO NOT GIVE 2 TABLETS OF CARBIDOPA LEVODOPA 25/100 IR.  6. Increase Carbidopa Levodopa 25/100 IR- 1 tablet to take with each Apokyn dose as needed- up to 5 times daily.   7. Please fax a copy of the bedside swallow study to [phone].

## 2015-03-31 NOTE — Telephone Encounter (Signed)
Cody [name] with [facility] called. She has asked that we send a script for the patient's Levodopa / Carbidopa that is marked as "Dispense as written". The patient does not want the generic and the nursing home can not provide a name brand unless specified by the provider. CB# [phone] / Sherri S.

## 2015-03-31 NOTE — Progress Notes (Signed)
Cody [name] was seen today in the movement disorders clinic for neurologic consultation.  His PCP is [name], MD.  The consultation is for the evaluation of PD.  Prior records from [name] were reviewed.  He was last seen in 01/2013 but apparently had been seen there since 2005 with sx's since 2003.  He has been seen at the [facility] medical center since 2014.    Pt states that in 09/2004 he was noting L leg dragging and he was then referred to [name] and dx with PD.  He developed tremor a few years later.  He was started on requip and titrated up on the drug but he had trouble tolerating it because of EDS and was only taking it at night.  He doesn't think that he was ever on just carbidopa/levodopa and has always been on Stalevo 200 mg.  He is on it 5 times per day.  He takes it at 6am/8:30am/11am/1:30 and 4pm.  He is now on 12 mg qhs of requip.  According to [name] notes, she had tried him on daytime dosages in the past and it made him very sleepy and so he consolidated all of the medication at night.  Therefore, she switched him to Requip XL 12 mg, but when he changed to the [facility] they switched him back to regular ropinirole and while the bottle says 4 mg 3 times a day, the patient states that he was always told to take all 12 mg at night.  He was seen by [name] at the [facility] in [facility] in June and they were going to re-evaluate possibility for DBS in November but then he had an episode of nephrolithiasis and didn't go back.  States that wants to try and get care locally.      He goes to the [employer] faithfully and has gotten around much better with faithful exercise.  01/31/15 update:  The patient is following up today, much sooner than expected.  He is accompanied by his girlfriend who supplements the history.  He has a history of Parkinson's disease with motor fluctuations.  Last visit, multiple recommendations were given but he did not want me to proceed with these  recommendations as he wanted to pursue them at the [facility] because of cost.  He has been taking ropinirole, 12 mg at night because he is unable to tolerate daytime dosages, but I explained to the patient that regular ropinirole was meant to be dosed 3 times per day.  If he needs a dosed once per day then he needs to change to the XL formulation.  He was recently able to change to Requip XL, 6 mg and is taking 2 of them at night.  I also recommended that we add carbidopa/levodopa 50/200 at night to his Stalevo 200 mg that he is already taking at 6 AM/8:30 AM/11 AM/1:30 PM/4 PM.  He states that he accidentally took 6 dosages of Stalevo one day instead of 5 and actually felt great.  He had no freezing that today.  A modified barium swallow was also recommended, buttock and he wished to pursue that as well as an MRI of the brain through the [name].  He had a modified barium swallow scheduled several times, but ended up canceling it because of days that he was not feeling well or was freezing.  He needs to reschedule that.   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  His girlfriend found him on the floor a few days ago and was on the floor a few hours.  He got down to get something and couldn't get back up.  He has not had any falls.  He just has been experiencing more freezing.  They took him to the podiatrist a few days ago and he ended up freezing while in the bathroom and could not get out.  03/31/15 update:  The patient is following up today.  His daughter is with him and supplements the history.  Records were reviewed since last visit.  He went to the ER not long after our last visit complaining of freezing and ultimately was admitted to [facility] extended-care facility.  He is on Stalevo 200 mg at 6 AM/8:30 AM/11 AM/1:30 PM/4 PM/6:30 PM and carbidopa/levodopa 50/200 at night.  States that SNF is sometimes substituting the IR 25/100 at night and giving 2  instead of the 50/200 CR despite him telling them it is not the same.   He is on Requip XL 12 mg daily.  Apokyn was started since last visit.  He is up to 0.3 mg as needed.  Unfortunately, [name] doesn't send his med list today but he states that he is using this 5 times per day.  He has already used it 3 times this AM (it is currently 9:45 AM).  He has to have nursing give it to him as he is generally so frozen that he cannot adminster to himself.  I have wanted him to have an MRI of the brain as well as a modified barium swallow, but he has wanted to have those through the [facility].  Pt states that he had a bedside swallow at [facility].    Neuroimaging has not previously been performed.   PREVIOUS MEDICATIONS: Sinemet and Amantadine  (unsure why d/c); azilect (? D/C due to cost); not able to tolerate daytime dosages of requip  ALLERGIES:   Allergies  Allergen Reactions  . Amoxapine And Related Swelling  . Amoxicillin Swelling  . Ceftriaxone Rash    Morbilliform rash on back, flexor creases, chest  . Imipenem Rash    Confluent maculopapular/morbilliform rash on back, arms, chest    CURRENT MEDICATIONS:  Outpatient Encounter Prescriptions as of 03/31/2015  Medication Sig  . Apomorphine HCl (APOKYN [place]) Inject into the skin. 3-5 times daily  Currently on 0.3 mg (titrating)  . carbidopa-levodopa (SINEMET CR) 50-200 MG per tablet Take 1 tablet by mouth at bedtime. (Patient taking differently: Take 1 tablet by mouth 6 (six) times daily. )  . carbidopa-levodopa (SINEMET IR) 25-100 MG per tablet Take 1 tablet by mouth 2 (two) times daily.  . carbidopa-levodopa-entacapone (STALEVO) 50-200-200 MG per tablet Take 1 tablet by mouth. 7 times daily  . cholecalciferol (VITAMIN D) 1000 UNITS tablet Take 3,000 Units by mouth daily.  Marland Kitchen glipiZIDE (GLUCOTROL) 5 MG tablet Take 5 mg by mouth 2 (two) times daily before a meal.  . lisinopril-hydrochlorothiazide (PRINZIDE,ZESTORETIC) 10-12.5 MG  per tablet Take 1 tablet by mouth daily.  . metFORMIN (GLUCOPHAGE) 500 MG tablet Take 1,000 mg by mouth 2 (two) times daily with a meal.  . Ropinirole HCl 12 MG TB24 Take 1 tablet (12 mg total) by mouth daily.  . sertraline (ZOLOFT) 100 MG tablet Take 100 mg by mouth daily.   Marland Kitchen trimethobenzamide (TIGAN) 300 MG capsule Take 1 capsule (300 mg total) by mouth 3 (three) times daily. Start medication 3 days prior  to starting Apokyn  . [DISCONTINUED] clindamycin (CLEOCIN) 150 MG capsule Take 150 mg by mouth 2 (two) times daily.   No facility-administered encounter medications on file as of 03/31/2015.    PAST MEDICAL HISTORY:   Past Medical History  Diagnosis Date  . Back pain     lumbar  . Urgency of urination   . Hammer toe   . Diabetes mellitus   . Unspecified essential hypertension   . Parkinson disease   . Renal stone     PAST SURGICAL HISTORY:   Past Surgical History  Procedure Laterality Date  . Toe surgery Right     big toe  . Toe surgery Left     2nd toe  . Cystoscopy/retrograde/ureteroscopy Right 08/22/2014    Procedure: Cystoscopy, Right Retrograde Pyelogram, Right Ureteral Stent Placement;  Surgeon: Garnett Farm, MD;  Location: [facility];  Service: Urology;  Laterality: Right;    SOCIAL HISTORY:   History   Social History  . Marital Status: Widowed    Spouse Name: N/A  . Number of Children: 1  . Years of Education: N/A   Occupational History  . retired     Engineer, structural to [employer]   Social History Main Topics  . Smoking status: Never Smoker   . Smokeless tobacco: Not on file  . Alcohol Use: No  . Drug Use: No  . Sexual Activity: Not on file   Other Topics Concern  . Not on file   Social History Narrative    FAMILY HISTORY:   Family Status  Relation Status Death Age  . Mother Deceased     heart failure, DM  . Father Deceased     stroke  . Daughter Alive     healthy    ROS:  A complete 10 system review of systems was obtained and was  unremarkable apart from what is mentioned above.  PHYSICAL EXAMINATION:    VITALS:   Filed Vitals:   03/31/15 0934  BP: 142/64  Pulse: 84  Height: 5\' 10"  (1.778 m)  Weight: 218 lb (98.884 kg)    GEN:  The patient appears stated age and is in NAD. HEENT:  Normocephalic, atraumatic.  The mucous membranes are moist. The superficial temporal arteries are without ropiness or tenderness. CV:  RRR Lungs:  CTAB Neck/HEME:  There are no carotid bruits bilaterally.  Neurological examination:  Orientation: The patient is alert and oriented x3. Fund of knowledge is appropriate.  Recent and remote memory are intact.  Attention and concentration are normal.    Able to name objects and repeat phrases. Cranial nerves: There is good facial symmetry.  There is significant facial hypomimia.   Extraocular muscles are intact. The visual fields are full to confrontational testing. The speech is fluent and clear.  He is mildly hypophonic.  Soft palate rises symmetrically and there is no tongue deviation. Hearing is intact to conversational tone. Sensation: Sensation is intact to light touch throughout. Motor: Strength is 5/5 in the bilateral upper and lower extremities.   Shoulder shrug is equal and symmetric.  There is no pronator drift.   Movement examination: Tone: There is normal tone today Abnormal movements: He is very dyskinetic today but just had apokyn today.  About 3/4 through the examination, the apokyn wore off and he froze and couldn't move Coordination:  Patient has mild decremation with toe taps on the left.  Otherwise, rapid alternating movements were normal. Gait and Station: The patient easily arose out of the  chair and easily walked down the hall (before he froze and apokyn wore off).    ASSESSMENT/PLAN:  1.  Idiopathic, akinetic rigid Parkinson's disease, diagnosed in 2005 with symptoms dating back perhaps to 2003  -The patient's Parkinson's disease is now complicated by  freezing/motor fluctuations and dyskinesia.    -  He has just changed to what appears to be Requip XL (he is getting this at the [place] and it does not say Requip XL, but I think it is their version of the extended release form), 12 mg at night.  He has not been able to tolerate daytime dosages.  -He has just added carbidopa/levodopa 50/200 at night.  -I would continue on Stalevo 200 mg 7 times a day.  -He used to be on amantadine but is off of it now.  It appears that perhaps it was discontinued when he went to the [place].  I don't think he had side effects.  He is again having dyskinesia now that he is using Apokyn and we may need it again  -Apokyn is working well.  He is up to Apokyn, 0.3 mg 5 times per day.  I told him he really needs to take his Stalevo or his extra carbidopa/levodopa 25/100 at the same time he takes his extra Apokyn.  I gave him permission to take up to 5 extra carbidopa/levodopa 25/100 as he takes 5 Apokyn per day, but I told him I would rather have him limit it to 3.  If he takes 5, however, he will end up with a total daily levodopa dose of 2100 mg, which is rather significant.  He and I talked about this today.  -We had a lengthy discussion again about DBS therapy.  He is very interested.  He no longer wants to go through the [facility].  His daughter was not present for previous discussions so we started at the beginning and discussed risks/benefits/side effects.  We discussed preoperative testing.  We also discussed what happens in the postop period.  He expressed understanding.  He would like to proceed with the on/off test.  -I no longer saw the spastic gait now that he is on Apokyn.  I did tell him we would need a preop MRI of we got that far, but we will first do the on/off test and neuropsych testing. 2.  Sialorrhea  -Talked about the value of Myobloc.  He is not interested right now, but will let me know if this symptom gets worse. 3.  Dysphagia.  -The patient apparently  had a bedside swallow evaluation at [facility].  This is not as good as the modified barium swallow, but he states that he has actually clinically been doing fairly well in this regard.  I will charted get a copy of the bedside swallow eval. 4.  Constipation  -This is very common with Parkinson's disease.  The patient was given a copy of the rancho recipe. 5.  Obstructive sleep apnea syndrome  -The patient is having difficulty tolerating CPAP, but was just started on it about 3 months ago.  I encouraged him to continue to try to use CPAP.  We talked about morbidity and mortality associated with untreated sleep apnea 6.  Much greater than 50% of this visit was spent in counseling with the patient and the family.  Total face to face time:  45 min

## 2015-04-05 ENCOUNTER — Telehealth: Payer: Self-pay | Admitting: Neurology

## 2015-04-05 ENCOUNTER — Telehealth: Payer: Self-pay | Admitting: *Deleted

## 2015-04-05 NOTE — Telephone Encounter (Signed)
[facility] pharmacy call in reference to the Apokyn Rx sent in Call back number [phone]

## 2015-04-05 NOTE — Telephone Encounter (Signed)
Called back and spoke with Brett Canales and verified that [employer] is DAW.

## 2015-04-05 NOTE — Telephone Encounter (Signed)
Spoke with patient and he states that the new medication dosages are not working for him. He has had 4 episodes of freezing today before 1:00 pm. I advised they are still working on increasing his Apokyn dose. He wants to know if there is anything to do to help? Please advise.

## 2015-04-05 NOTE — Telephone Encounter (Signed)
Unfortunately, other than DBS, I think that there is very little to offer.  I don't think that changing to rytary will be beneficial (plus while in ECF we can't give samples and virtually impossible to start without those).  They are still working on adjusting his apokyn dose and I think that he will need to be patient while we do that.

## 2015-04-06 NOTE — Telephone Encounter (Signed)
Patient made aware of message from Dr Tat.

## 2015-04-06 NOTE — Telephone Encounter (Signed)
Left message on machine for patient to call back.

## 2015-04-10 ENCOUNTER — Telehealth: Payer: Self-pay | Admitting: Neurology

## 2015-04-10 NOTE — Telephone Encounter (Signed)
Lmovm to return my call. 

## 2015-04-10 NOTE — Telephone Encounter (Signed)
See below, please return call to patient on behalf of Dr. Arbutus Leas / Lesly Rubenstein. Sherri

## 2015-04-10 NOTE — Telephone Encounter (Signed)
Pt wants to talk to someone about the test that he is having done on wed and also the problems he is having with his feet please call [phone]

## 2015-04-10 NOTE — Telephone Encounter (Signed)
Patient return my call. He wanted to know how he would be here for his on/off test on Wed. I told patient that he would be in the office for about an hour.

## 2015-04-11 ENCOUNTER — Telehealth: Payer: Self-pay | Admitting: Neurology

## 2015-04-11 NOTE — Telephone Encounter (Signed)
Please re-fax Apokyn script/statement of medical necessity to include "Pen Pack", this needs needs to be selected, Fax - [phone], phone [phone], option [phone]. / Oneita Kras.

## 2015-04-11 NOTE — Telephone Encounter (Signed)
[facility] called in regards to pt wanting a prescription for  Apokyn/Dawn CB# [phone]

## 2015-04-12 ENCOUNTER — Other Ambulatory Visit: Payer: Self-pay | Admitting: *Deleted

## 2015-04-12 ENCOUNTER — Ambulatory Visit (INDEPENDENT_AMBULATORY_CARE_PROVIDER_SITE_OTHER): Payer: PPO | Admitting: Neurology

## 2015-04-12 ENCOUNTER — Encounter: Payer: Self-pay | Admitting: Neurology

## 2015-04-12 VITALS — BP 130/76 | HR 94 | Ht 70.0 in

## 2015-04-12 DIAGNOSIS — G249 Dystonia, unspecified: Secondary | ICD-10-CM | POA: Diagnosis not present

## 2015-04-12 DIAGNOSIS — K5901 Slow transit constipation: Secondary | ICD-10-CM

## 2015-04-12 DIAGNOSIS — G20 Parkinson's disease: Secondary | ICD-10-CM | POA: Diagnosis not present

## 2015-04-12 MED ORDER — CARBIDOPA-LEVODOPA 25-100 MG PO TABS
3.0000 | ORAL_TABLET | Freq: Three times a day (TID) | ORAL | Status: DC
  Administered 2015-04-12: 3 via ORAL

## 2015-04-12 MED ORDER — APOMORPHINE HCL 10 MG/ML ~~LOC~~ SOLN: 10.0000 mg | Freq: Every day | SUBCUTANEOUS | Status: DC

## 2015-04-12 NOTE — Telephone Encounter (Signed)
Spoke with Cody [name] the [facility] [employer] nurse who is working with Cody [name] he will fax over another order form to be signed by Dr. Arbutus Leas. And faxed back to the pharmacy

## 2015-04-12 NOTE — Progress Notes (Signed)
Cody [name] was seen today in the movement disorders clinic for neurologic consultation.  His PCP is Cody Espy, MD.  The consultation is for the evaluation of PD.  Prior records from Dr. Terrace [name] were reviewed.  He was last seen in 01/2013 but apparently had been seen there since 2005 with sx's since 2003.  He has been seen at the [facility] medical center since 2014.    Pt states that in 09/2004 he was noting L leg dragging and he was then referred to [name] and dx with PD.  He developed tremor a few years later.  He was started on requip and titrated up on the drug but he had trouble tolerating it because of EDS and was only taking it at night.  He doesn't think that he was ever on just carbidopa/levodopa and has always been on Stalevo 200 mg.  He is on it 5 times per day.  He takes it at 6am/8:30am/11am/1:30 and 4pm.  He is now on 12 mg qhs of requip.  According to Dr. Zannie [name] notes, she had tried him on daytime dosages in the past and it made him very sleepy and so he consolidated all of the medication at night.  Therefore, she switched him to Requip XL 12 mg, but when he changed to the [facility] they switched him back to regular ropinirole and while the bottle says 4 mg 3 times a day, the patient states that he was always told to take all 12 mg at night.  He was seen by Dr. Jerold [name] at the [facility] in [facility] in June and they were going to re-evaluate possibility for DBS in November but then he had an episode of nephrolithiasis and didn't go back.  States that wants to try and get care locally.      He goes to the [employer] faithfully and has gotten around much better with faithful exercise.  01/31/15 update:  The patient is following up today, much sooner than expected.  He is accompanied by his Cody [name] who supplements the history.  He has a history of Parkinson's disease with motor fluctuations.  Last visit, multiple recommendations were given but he did not want me to proceed with these  recommendations as he wanted to pursue them at the [facility] because of cost.  He has been taking ropinirole, 12 mg at night because he is unable to tolerate daytime dosages, but I explained to the patient that regular ropinirole was meant to be dosed 3 times per day.  If he needs a dosed once per day then he needs to change to the XL formulation.  He was recently able to change to Requip XL, 6 mg and is taking 2 of them at night.  I also recommended that we add carbidopa/levodopa 50/200 at night to his Stalevo 200 mg that he is already taking at 6 AM/8:30 AM/11 AM/1:30 PM/4 PM.  He states that he accidentally took 6 dosages of Stalevo one day instead of 5 and actually felt great.  He had no freezing that today.  A modified barium swallow was also recommended, buttock and he wished to pursue that as well as an MRI of the brain through the [name].  He had a modified barium swallow scheduled several times, but ended up canceling it because of days that he was not feeling well or was freezing.  He needs to reschedule that.   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  His Cody [name] found him on the floor a few days ago and was on the floor a few hours.  He got down to get something and couldn't get back up.  He has not had any falls.  He just has been experiencing more freezing.  They took him to the podiatrist a few days ago and he ended up freezing while in the bathroom and could not get out.  03/31/15 update:  The patient is following up today.  His Cody [name] is with him and supplements the history.  Records were reviewed since last visit.  He went to the ER not long after our last visit complaining of freezing and ultimately was admitted to [name] extended-care facility.  He is on Stalevo 200 mg at 6 AM/8:30 AM/11 AM/1:30 PM/4 PM/6:30 PM and carbidopa/levodopa 50/200 at night.  States that SNF is sometimes substituting the IR 25/100 at night and giving 2  instead of the 50/200 CR despite him telling them it is not the same.   He is on Requip XL 12 mg daily.  Apokyn was started since last visit.  He is up to 0.3 mg as needed.  Unfortunately, [name] doesn't send his med list today but he states that he is using this 5 times per day.  He has already used it 3 times this AM (it is currently 9:45 AM).  He has to have nursing give it to him as he is generally so frozen that he cannot adminster to himself.  I have wanted him to have an MRI of the brain as well as a modified barium swallow, but he has wanted to have those through the [facility].  Pt states that he had a bedside swallow at [facility].    04/12/15 update:  The patient is following up today.  He is accompanied by a Cody [name]) who supplements the history and asks questions.  The patient reports that he really has not been doing well.  He has had to take a significant amount of Apokyn.  He is still on a 0.3 ML dose.  They are still titrating the dose.  He is having a significant amount of off time.  Neuroimaging has not previously been performed.   PREVIOUS MEDICATIONS: Sinemet and Amantadine  (unsure why d/c); azilect (? D/C due to cost); not able to tolerate daytime dosages of requip  ALLERGIES:   Allergies  Allergen Reactions  . Amoxapine And Related Swelling  . Amoxicillin Swelling  . Ceftriaxone Rash    Morbilliform rash on back, flexor creases, chest  . Imipenem Rash    Confluent maculopapular/morbilliform rash on back, arms, chest    CURRENT MEDICATIONS:  Outpatient Encounter Prescriptions as of 04/12/2015  Medication Sig  . APOMORPHINE HYDROCHLORIDE (APOKYN) 10 MG/ML SOLN Inject 1 mL (10 mg total) into the skin daily. 3-5 times daily  Currently on 0.3 mg (titrating)  . carbidopa-levodopa (SINEMET CR) 50-200 MG per tablet Take 1 tablet by mouth at bedtime.  . carbidopa-levodopa (SINEMET IR) 25-100 MG per tablet Take 1 tablet by mouth 5 (five) times daily as  needed. Take with Apokyn injection  . carbidopa-levodopa-entacapone (STALEVO) 50-200-200 MG per tablet Take one tablet 7 times daily  . cholecalciferol (VITAMIN D) 1000 UNITS tablet Take 3,000 Units by mouth daily.  Marland Kitchen glipiZIDE (GLUCOTROL) 5 MG tablet Take 5 mg by mouth 2 (two) times daily before a meal.  . lisinopril-hydrochlorothiazide (PRINZIDE,ZESTORETIC) 10-12.5 MG per tablet Take 1 tablet by mouth daily.  Marland Kitchen  metFORMIN (GLUCOPHAGE) 500 MG tablet Take 1,000 mg by mouth 2 (two) times daily with a meal.  . Ropinirole HCl 12 MG TB24 Take 1 tablet (12 mg total) by mouth daily.  . sertraline (ZOLOFT) 100 MG tablet Take 100 mg by mouth daily.   Marland Kitchen trimethobenzamide (TIGAN) 300 MG capsule Take 1 capsule (300 mg total) by mouth 3 (three) times daily. Start medication 3 days prior to starting Apokyn   Facility-Administered Encounter Medications as of 04/12/2015  Medication  . carbidopa-levodopa (SINEMET IR) 25-100 MG per tablet immediate release 3 tablet    PAST MEDICAL HISTORY:   Past Medical History  Diagnosis Date  . Back pain     lumbar  . Urgency of urination   . Hammer toe   . Diabetes mellitus   . Unspecified essential hypertension   . Parkinson disease   . Renal stone     PAST SURGICAL HISTORY:   Past Surgical History  Procedure Laterality Date  . Toe surgery Right     big toe  . Toe surgery Left     2nd toe  . Cystoscopy/retrograde/ureteroscopy Right 08/22/2014    Procedure: Cystoscopy, Right Retrograde Pyelogram, Right Ureteral Stent Placement;  Surgeon: Garnett Farm, MD;  Location: [facility];  Service: Urology;  Laterality: Right;    SOCIAL HISTORY:   History   Social History  . Marital Status: Widowed    Spouse Name: N/A  . Number of Children: 1  . Years of Education: N/A   Occupational History  . retired     Engineer, structural to [employer]   Social History Main Topics  . Smoking status: Never Smoker   . Smokeless tobacco: Not on file  . Alcohol Use: No  . Drug Use:  No  . Sexual Activity: Not on file   Other Topics Concern  . Not on file   Social History Narrative    FAMILY HISTORY:   Family Status  Relation Status Death Age  . Mother Deceased     heart failure, DM  . Father Deceased     stroke  . Cody [name] Alive     healthy    ROS:  A complete 10 system review of systems was obtained and was unremarkable apart from what is mentioned above.  PHYSICAL EXAMINATION:    VITALS:   Filed Vitals:   04/12/15 1418  BP: 130/76  Pulse: 94  Height:  (1.778 m)  SpO2: 98%    GEN:  The patient appears stated age and is in NAD. HEENT:  Normocephalic, atraumatic.  The mucous membranes are moist. The superficial temporal arteries are without ropiness or tenderness. CV:  RRR Lungs:  CTAB Neck/HEME:  There are no carotid bruits bilaterally.  Neurological examination:  Orientation: The patient is alert and oriented x3. Fund of knowledge is appropriate.  Recent and remote memory are intact.  Attention and concentration are normal.    Able to name objects and repeat phrases. Cranial nerves: There is good facial symmetry. The speech is fluent and clear.  He is mildly hypophonic.  Soft palate rises symmetrically and there is no tongue deviation. Hearing is intact to conversational tone. Sensation: Sensation is intact to light touch throughout. Motor: Strength is 5/5 in the bilateral upper and lower extremities.   Shoulder shrug is equal and symmetric.  There is no pronator drift.   Movement examination:  A complete UPDRS motor on/off test was done today and is documented in a separate neuro-physiologically worksheet.  UPDRS  motor off score was 41.  Following this, 300 mg of levodopa dissolved in ginger ale was given to the patient and his allowed to sit for 30 minutes.  Upon coming back, the patient was quite dyskinetic.  UPDRS motor on score was 20.  ASSESSMENT/PLAN:  1.  Idiopathic, akinetic rigid Parkinson's disease, diagnosed in 2005 with  symptoms dating back perhaps to 2003  -The patient's Parkinson's disease is now complicated by freezing/motor fluctuations and dyskinesia.    -  He has just changed to what appears to be Requip XL (he is getting this at the [place] and it does not say Requip XL, but I think it is their version of the extended release form), 12 mg at night.  He has not been able to tolerate daytime dosages.  -He has just added carbidopa/levodopa 50/200 at night.  -I would continue on Stalevo 200 mg 7 times a day.  -He used to be on amantadine but is off of it now.  It appears that perhaps it was discontinued when he went to the [place].  I don't think he had side effects.  He is again having dyskinesia now that he is using Apokyn and we may need it again  -Apokyn is working well.  He is up to Apokyn, 0.3 mg 5 times per day.  I told him he really needs to take his Stalevo or his extra carbidopa/levodopa 25/100 at the same time he takes his extra Apokyn.  I gave him permission to take up to 5 extra carbidopa/levodopa 25/100 as he takes 5 Apokyn per day, but I told him I would rather have him limit it to 3.  If he takes 5, however, he will end up with a total daily levodopa dose of 2100 mg, which is rather significant.  He and I talked about this today.  -We had a lengthy discussion again about DBS therapy following the on/off test.  We talked in detail about proper expectations.  I explained to him that DBS does not help balance.  We talked extensively about the risks and benefits of DBS therapy.  We also talked about duopa in detail and he was shown the pump.  We also talked about risks and benefits of this therapy.  He wants to think about both of these options and he will call me back and let me know if he decides to proceed with any/either of these options.  Offered to let him talk to patient representatives as well as company representatives if he would like.  Much greater than 50% of the 60 minute face-to-face time was spent in  counseling. 2.  Sialorrhea  -Talked about the value of Myobloc.  He is not interested right now, but will let me know if this symptom gets worse. 3.  Dysphagia.  -The patient apparently had a bedside swallow evaluation at [facility].  I still have not received a copy of this. 4.  Constipation  -This is very common with Parkinson's disease.  The patient was given a copy of the rancho recipe. 5.  Obstructive sleep apnea syndrome  -The patient is having difficulty tolerating CPAP, but was just started on it about 3 months ago.  I encouraged him to continue to try to use CPAP.  We talked about morbidity and mortality associated with untreated sleep apnea

## 2015-04-13 ENCOUNTER — Telehealth: Payer: Self-pay | Admitting: Neurology

## 2015-04-13 ENCOUNTER — Ambulatory Visit: Payer: PPO | Admitting: Neurology

## 2015-04-13 NOTE — Telephone Encounter (Signed)
See documentation below, please return call on Jade / Dr. Don Perking behalf / Oneita Kras.

## 2015-04-13 NOTE — Telephone Encounter (Signed)
A verbal order has been placed for 1 pen pack to the [facility]

## 2015-04-13 NOTE — Telephone Encounter (Signed)
Dois Davenport called in regards to having a prescription called in for a Apokyn Pen Pak/Dawn CB# [phone] Option#3 Ext:2113/Special[facility] Pharm[facility] [phone]

## 2015-04-18 ENCOUNTER — Ambulatory Visit: Payer: PPO | Admitting: Neurology

## 2015-04-19 ENCOUNTER — Telehealth: Payer: Self-pay | Admitting: Neurology

## 2015-04-19 NOTE — Telephone Encounter (Signed)
[facility] about [facility] dosage. Patient insisting that his dose was increased. Made aware Apokyn is slowly titrating patient on his dosage. We have given an order to titrate from Apokyn 0.3-0.5 as they see fit. Number given to Selena BattenKim to speak with Leonette Mostharles [facility](Apokyn nurse [phone]) about what dose patient should currently be taking.

## 2015-04-26 ENCOUNTER — Telehealth: Payer: Self-pay | Admitting: Neurology

## 2015-04-26 NOTE — Telephone Encounter (Signed)
Nurse from [facility] [phone]) called about increasing Apokyn dosage. Made her aware I spoke with someone last week and advised they call Antoine Primasharles, Apokyn nurse, to see if they are adjusting titration dose since Dr Tat wrote that we could titrate up to Apokyn 0.5 mg. Leonette [name] states they can not increase Apokyn dose that this has to be written by Dr Tat.   Dr Tat- please advise if you would like to increase Apokyn dose higher than the current 0.3 mg?

## 2015-04-27 NOTE — Telephone Encounter (Signed)
Can you find out from charles if the next titration is 0.4?  And why did I sign the titration form if the nursing home can't take that order?

## 2015-05-01 ENCOUNTER — Telehealth: Payer: Self-pay | Admitting: Neurology

## 2015-05-01 MED ORDER — APOMORPHINE HCL 10 MG/ML ~~LOC~~ SOLN: 10.0000 mg | Freq: Every day | SUBCUTANEOUS | Status: DC

## 2015-05-01 NOTE — Telephone Encounter (Signed)
Patient states he is having trouble getting his medication adjusted.  He thinks he should be taking 0.4mg  Apokyn.  Is it ok to send in refill?  If so need to call [name] [phone] fax order).

## 2015-05-01 NOTE — Telephone Encounter (Signed)
Pt wants to talk to someone about medication please call [phone]

## 2015-05-01 NOTE — Telephone Encounter (Signed)
Order for Apokyn 0.4mg  printed and faxed to [phone] per patient request.  Patient informed.

## 2015-05-01 NOTE — Telephone Encounter (Signed)
Susie sent a "closed message" to [facility] about this.  So, yes.  0.4 is fine.  I have signed this order previously.

## 2015-05-01 NOTE — Telephone Encounter (Signed)
Jade please advise on this .

## 2015-05-02 ENCOUNTER — Telehealth: Payer: Self-pay | Admitting: Neurology

## 2015-05-02 MED ORDER — APOMORPHINE HCL 10 MG/ML ~~LOC~~ SOLN: 0.4000 mL | Freq: Every day | SUBCUTANEOUS | Status: AC

## 2015-05-02 MED ORDER — APOMORPHINE HCL 10 MG/ML ~~LOC~~ SOLN: 0.4000 mg | Freq: Every day | SUBCUTANEOUS | Status: DC

## 2015-05-02 NOTE — Telephone Encounter (Signed)
Spoke with Leonette Mostharles, nurse with [facility], who advised we will have to send specific dosage orders to [facility] since they will not take a range. Apokyn 0.4 mg order already sent to [facility] and documented in prior encounter.

## 2015-05-02 NOTE — Telephone Encounter (Signed)
Spoke with Harrell GaveBethel who needs clarified order sent for Apokyn 0.4 mg - order faxed to [phone] with confirmation received.

## 2015-05-02 NOTE — Telephone Encounter (Signed)
Pt nurse Bethel  From [facility] called about Pt. Cody [name] current  medication dosage call back at [phone]

## 2015-06-01 ENCOUNTER — Encounter (HOSPITAL_COMMUNITY): Payer: Self-pay | Admitting: Emergency Medicine

## 2015-06-01 ENCOUNTER — Emergency Department (HOSPITAL_COMMUNITY)
Admission: EM | Admit: 2015-06-01 | Discharge: 2015-06-01 | Disposition: A | Discharge status: Home / Self Care | Payer: PPO | Attending: Emergency Medicine | Admitting: Emergency Medicine

## 2015-06-01 ENCOUNTER — Emergency Department (HOSPITAL_COMMUNITY): Payer: PPO

## 2015-06-01 DIAGNOSIS — Z8739 Personal history of other diseases of the musculoskeletal system and connective tissue: Secondary | ICD-10-CM | POA: Insufficient documentation

## 2015-06-01 DIAGNOSIS — Z87442 Personal history of urinary calculi: Secondary | ICD-10-CM | POA: Diagnosis not present

## 2015-06-01 DIAGNOSIS — G20 Parkinson's disease: Secondary | ICD-10-CM | POA: Diagnosis not present

## 2015-06-01 DIAGNOSIS — G4700 Insomnia, unspecified: Secondary | ICD-10-CM

## 2015-06-01 DIAGNOSIS — Z880 Allergy status to penicillin: Secondary | ICD-10-CM | POA: Insufficient documentation

## 2015-06-01 DIAGNOSIS — Z79899 Other long term (current) drug therapy: Secondary | ICD-10-CM | POA: Diagnosis not present

## 2015-06-01 DIAGNOSIS — R0602 Shortness of breath: Secondary | ICD-10-CM | POA: Diagnosis not present

## 2015-06-01 DIAGNOSIS — I10 Essential (primary) hypertension: Secondary | ICD-10-CM | POA: Diagnosis not present

## 2015-06-01 DIAGNOSIS — K5900 Constipation, unspecified: Secondary | ICD-10-CM | POA: Diagnosis not present

## 2015-06-01 DIAGNOSIS — E119 Type 2 diabetes mellitus without complications: Secondary | ICD-10-CM | POA: Diagnosis not present

## 2015-06-01 LAB — CBC WITH DIFFERENTIAL/PLATELET
BASOS PCT: 0 % (ref 0–1)
Basophils Absolute: 0 10*3/uL (ref 0.0–0.1)
EOS ABS: 0.8 10*3/uL — AB (ref 0.0–0.7)
Eosinophils Relative: 11 % — ABNORMAL HIGH (ref 0–5)
HCT: 43.2 % (ref 39.0–52.0)
HEMOGLOBIN: 14.6 g/dL (ref 13.0–17.0)
Lymphocytes Relative: 27 % (ref 12–46)
Lymphs Abs: 1.8 10*3/uL (ref 0.7–4.0)
MCH: 30.2 pg (ref 26.0–34.0)
MCHC: 33.8 g/dL (ref 30.0–36.0)
MCV: 89.3 fL (ref 78.0–100.0)
MONO ABS: 0.5 10*3/uL (ref 0.1–1.0)
Monocytes Relative: 7 % (ref 3–12)
NEUTROS PCT: 55 % (ref 43–77)
Neutro Abs: 3.8 10*3/uL (ref 1.7–7.7)
Platelets: 220 10*3/uL (ref 150–400)
RBC: 4.84 MIL/uL (ref 4.22–5.81)
RDW: 13 % (ref 11.5–15.5)
WBC: 6.9 10*3/uL (ref 4.0–10.5)

## 2015-06-01 LAB — BASIC METABOLIC PANEL
Anion gap: 10 (ref 5–15)
BUN: 21 mg/dL — ABNORMAL HIGH (ref 6–20)
CALCIUM: 9.7 mg/dL (ref 8.9–10.3)
CO2: 26 mmol/L (ref 22–32)
CREATININE: 1 mg/dL (ref 0.61–1.24)
Chloride: 98 mmol/L — ABNORMAL LOW (ref 101–111)
GFR calc Af Amer: >60 mL/min (ref >60)
GFR calc non Af Amer: >60 mL/min (ref >60)
GLUCOSE: 142 mg/dL — AB (ref 65–99)
Potassium: 4.3 mmol/L (ref 3.5–5.1)
Sodium: 134 mmol/L — ABNORMAL LOW (ref 135–145)

## 2015-06-01 NOTE — Discharge Instructions (Signed)
Insomnia Follow up with your primary care physician.  Return for any shortness of breath, chest pain, or diaphoresis. Insomnia is frequent trouble falling and/or staying asleep. Insomnia can be a long term problem or a short term problem. Both are common. Insomnia can be a short term problem when the wakefulness is related to a certain stress or worry. Long term insomnia is often related to ongoing stress during waking hours and/or poor sleeping habits. Overtime, sleep deprivation itself can make the problem worse. Every little thing feels more severe because you are overtired and your ability to cope is decreased. CAUSES   Stress, anxiety, and depression.  Poor sleeping habits.  Distractions such as TV in the bedroom.  Naps close to bedtime.  Engaging in emotionally charged conversations before bed.  Technical reading before sleep.  Alcohol and other sedatives. They may make the problem worse. They can hurt normal sleep patterns and normal dream activity.  Stimulants such as caffeine for several hours prior to bedtime.  Pain syndromes and shortness of breath can cause insomnia.  Exercise late at night.  Changing time zones may cause sleeping problems (jet lag). It is sometimes helpful to have someone observe your sleeping patterns. They should look for periods of not breathing during the night (sleep apnea). They should also look to see how long those periods last. If you live alone or observers are uncertain, you can also be observed at a sleep clinic where your sleep patterns will be professionally monitored. Sleep apnea requires a checkup and treatment. Give your caregivers your medical history. Give your caregivers observations your family has made about your sleep.  SYMPTOMS   Not feeling rested in the morning.  Anxiety and restlessness at bedtime.  Difficulty falling and staying asleep. TREATMENT   Your caregiver may prescribe treatment for an underlying medical disorders.  Your caregiver can give advice or help if you are using alcohol or other drugs for self-medication. Treatment of underlying problems will usually eliminate insomnia problems.  Medications can be prescribed for short time use. They are generally not recommended for lengthy use.  Over-the-counter sleep medicines are not recommended for lengthy use. They can be habit forming.  You can promote easier sleeping by making lifestyle changes such as:  Using relaxation techniques that help with breathing and reduce muscle tension.  Exercising earlier in the day.  Changing your diet and the time of your last meal. No night time snacks.  Establish a regular time to go to bed.  Counseling can help with stressful problems and worry.  Soothing music and white noise may be helpful if there are background noises you cannot remove.  Stop tedious detailed work at least one hour before bedtime. HOME CARE INSTRUCTIONS   Keep a diary. Inform your caregiver about your progress. This includes any medication side effects. See your caregiver regularly. Take note of:  Times when you are asleep.  Times when you are awake during the night.  The quality of your sleep.  How you feel the next day. This information will help your caregiver care for you.  Get out of bed if you are still awake after 15 minutes. Read or do some quiet activity. Keep the lights down. Wait until you feel sleepy and go back to bed.  Keep regular sleeping and waking hours. Avoid naps.  Exercise regularly.  Avoid distractions at bedtime. Distractions include watching television or engaging in any intense or detailed activity like attempting to balance the household checkbook.  Develop  a bedtime ritual. Keep a familiar routine of bathing, brushing your teeth, climbing into bed at the same time each night, listening to soothing music. Routines increase the success of falling to sleep faster.  Use relaxation techniques. This can be  using breathing and muscle tension release routines. It can also include visualizing peaceful scenes. You can also help control troubling or intruding thoughts by keeping your mind occupied with boring or repetitive thoughts like the old concept of counting sheep. You can make it more creative like imagining planting one beautiful flower after another in your backyard garden.  During your day, work to eliminate stress. When this is not possible use some of the previous suggestions to help reduce the anxiety that accompanies stressful situations. MAKE SURE YOU:   Understand these instructions.  Will watch your condition.  Will get help right away if you are not doing well or get worse. Document Released: 10/04/2000 Document Revised: 12/30/2011 Document Reviewed: 11/04/2007 [facility] Patient Information 2015 [place], [place]. This information is not intended to replace advice given to you by your health care provider. Make sure you discuss any questions you have with your health care provider.

## 2015-06-01 NOTE — ED Provider Notes (Signed)
CSN: [id number]     Arrival date & time 06/01/15  0400 History   First MD Initiated Contact with Patient 06/01/15 [phone]     Chief Complaint  Patient presents with  . Shortness of Breath     (Consider location/radiation/quality/duration/timing/severity/associated sxs/prior Treatment) Patient is a 69 y.o. male presenting with shortness of breath. The history is provided by the patient. No language interpreter was used.  Shortness of Breath Associated symptoms: no cough, no vomiting and no wheezing   Cody [name] is a 69 y.o male with a history of Diabetes, hypertension, back pain, Parkinson's disease, and renal stone who presents for shortness of breath that began at 2 AM today. He states he woke up at 12 AM feeling like his "body was frozen" and woke up his neighbor who has been staying at his house since he was discharged from [facility]. He states he was there for rehab due to parkinson's disease and his body "freezing up." He says he gets this feeling often and is followed by his neurologist.  "If I do not take his medications on time he usually gets this." His neighbor gave him a shot and a pill when he began feeling this way at 2am. Nothing makes his symptoms better or worse. He denies any fever, chills, dizziness, chest pain, abdominal pain, nausea, vomiting, diarrhea, increased leg swelling. He uses a wheelchair to ambulate at baseline. He denies any smoking history, COPD, asthma.  Past Medical History  Diagnosis Date  . Back pain     lumbar  . Urgency of urination   . Hammer toe   . Diabetes mellitus   . Unspecified essential hypertension   . Parkinson disease   . Renal stone    Past Surgical History  Procedure Laterality Date  . Toe surgery Right     big toe  . Toe surgery Left     2nd toe  . Cystoscopy/retrograde/ureteroscopy Right 08/22/2014    Procedure: Cystoscopy, Right Retrograde Pyelogram, Right Ureteral Stent Placement;  Surgeon: Garnett Farm, MD;  Location: [facility];   Service: Urology;  Laterality: Right;   Family History  Problem Relation Age of Onset  . High blood pressure Mother   . Diabetes Mother   . Stroke Father    Social History  Substance Use Topics  . Smoking status: Never Smoker   . Smokeless tobacco: None  . Alcohol Use: No    Review of Systems  Respiratory: Positive for shortness of breath. Negative for cough and wheezing.   Gastrointestinal: Positive for constipation. Negative for vomiting.  All other systems reviewed and are negative.     Allergies  Amoxapine and related; Amoxicillin; Ceftriaxone; and Imipenem  Home Medications   Prior to Admission medications   Medication Sig Start Date End Date Taking? Authorizing Provider  APOMORPHINE HYDROCHLORIDE (APOKYN) 10 MG/ML SOLN Inject 0.04 mLs (0.4 mg total) into the skin daily. 3-5 times daily  Currently on 0.4 mg (titrating) 05/02/15  Yes Rebecca S Tat, DO  atenolol (TENORMIN) 25 MG tablet Take 25 mg by mouth daily.   Yes Historical Provider, MD  carbidopa-levodopa (SINEMET CR) 50-200 MG per tablet Take 1 tablet by mouth at bedtime. 03/31/15  Yes Rebecca S Tat, DO  carbidopa-levodopa (SINEMET IR) 25-100 MG per tablet Take 1 tablet by mouth 5 (five) times daily as needed. Take with Apokyn injection 03/31/15  Yes Rebecca S Tat, DO  carbidopa-levodopa-entacapone (STALEVO) 50-200-200 MG per tablet Take one tablet 7 times daily 03/31/15  Yes Lurena Joiner  S Tat, DO  cholecalciferol (VITAMIN D) 1000 UNITS tablet Take 3,000 Units by mouth daily.   Yes Historical Provider, MD  glipiZIDE (GLUCOTROL) 5 MG tablet Take 5 mg by mouth 2 (two) times daily before a meal.   Yes Historical Provider, MD  lisinopril-hydrochlorothiazide (PRINZIDE,ZESTORETIC) 10-12.5 MG per tablet Take 1 tablet by mouth daily.   Yes Historical Provider, MD  metFORMIN (GLUCOPHAGE) 500 MG tablet Take 1,000 mg by mouth 2 (two) times daily with a meal.   Yes Historical Provider, MD  rasagiline (AZILECT) 1 MG TABS tablet Take 1  mg by mouth daily.   Yes Historical Provider, MD  Ropinirole HCl 12 MG TB24 Take 1 tablet (12 mg total) by mouth daily. 03/31/15  Yes Rebecca S Tat, DO  sertraline (ZOLOFT) 100 MG tablet Take 100 mg by mouth daily.    Yes Historical Provider, MD  trimethobenzamide (TIGAN) 300 MG capsule Take 1 capsule (300 mg total) by mouth 3 (three) times daily. Start medication 3 days prior to starting Apokyn 02/27/15  Yes Rebecca S Tat, DO  APOMORPHINE HYDROCHLORIDE (APOKYN) 10 MG/ML SOLN Inject 0.4 mLs (4 mg total) into the skin daily. 3-5 times daily  Currently on 0.4 mg (titrating) Patient not taking: Reported on 06/01/2015 05/02/15   Lurena Joiner S Tat, DO   BP 147/95 mmHg  Pulse 83  Temp(Src) 97.6 F (36.4 C) (Oral)  Resp 16  Ht  (1.778 m)  Wt 223 lb (101.152 kg)  BMI 32.00 kg/m2  SpO2 95% Physical Exam  Constitutional: He is oriented to person, place, and time. He appears well-developed and well-nourished.  HENT:  Head: Normocephalic and atraumatic.  Eyes: Conjunctivae are normal.  Neck: Normal range of motion. Neck supple.  Cardiovascular: Normal rate, regular rhythm and normal heart sounds.   Pulmonary/Chest: Effort normal and breath sounds normal. No accessory muscle usage. No respiratory distress. He has no decreased breath sounds. He has no wheezes. He has no rales. He exhibits no tenderness.  Lungs clear to auscultation bilaterally.  Abdominal: Soft. He exhibits no distension. There is no tenderness. There is no rebound and no guarding.  Musculoskeletal: Normal range of motion.  Neurological: He is alert and oriented to person, place, and time.  Skin: Skin is warm and dry.  Psychiatric: He has a normal mood and affect. His behavior is normal.  Nursing note and vitals reviewed.   ED Course  Procedures (including critical care time) Labs Review Labs Reviewed  CBC WITH DIFFERENTIAL/PLATELET - Abnormal; Notable for the following:    Eosinophils Relative 11 (*)    Eosinophils Absolute  0.8 (*)    All other components within normal limits  BASIC METABOLIC PANEL - Abnormal; Notable for the following:    Sodium 134 (*)    Chloride 98 (*)    Glucose, Bld 142 (*)    BUN 21 (*)    All other components within normal limits    Imaging Review Dg Chest 2 View  06/01/2015   CLINICAL DATA:  Chronic shortness of breath for 2 months. Mid chest pain and weakness. Initial encounter.  EXAM: CHEST  2 VIEW  COMPARISON:  Chest radiograph performed 02/08/2015  FINDINGS: The lungs are well-aerated. There is elevation of the right hemidiaphragm, as on the prior study. There is no evidence of focal opacification, pleural effusion or pneumothorax. Known tiny bilateral pulmonary nodules are not well characterized on radiograph.  The heart is normal in size; the mediastinal contour is within normal limits. No acute osseous  abnormalities are seen.  IMPRESSION: Elevation of the right hemidiaphragm.  Lungs remain grossly clear.   Electronically Signed   By: Roanna Raider M.D.   On: 06/01/2015 06:53     EKG Interpretation None      MDM   Final diagnoses:  Insomnia  Shortness of breath  Patient's vitals are stable. He is well appearing and in no acute distress.  I do not suspect a PE.  His labs are not concerning and comparable to previous labs.  Chest xray is negative for pneumonia, pneumothorax, or edema. I think he is here because he did not sleep well last night due to parkinson's but I have ruled out any life threatening causes. He states he was recently given medication by the [facility] for constipation. He denies any chest pain, diaphoresis, or recent illness.I discussed return precautions. Patient verbally agreed with the plan.     Catha Gosselin, PA-C 06/01/15 [address], PA-C 06/03/15 8119  Lavera Guise, MD 06/05/15 1320

## 2015-06-01 NOTE — ED Notes (Signed)
Patient transported to X-ray 

## 2015-06-01 NOTE — ED Notes (Signed)
Pt c/o "twisting up" do to Parkinson's and "unable to breathe". Pt speaking in complete sentences, O2 98%.

## 2015-06-01 NOTE — ED Notes (Signed)
Bed: BJ47 Expected date:  Expected time:  Means of arrival:  Comments: Nix

## 2015-06-01 NOTE — ED Notes (Signed)
Pt transferred to wheelchair per request. Call bell within reach. Denies needs at this time.

## 2015-06-01 NOTE — ED Notes (Signed)
Pt in xray will obtain labs when pt returns.

## 2015-06-01 NOTE — ED Notes (Signed)
Pt transported from [facility] after feeling short of breath after taking Parkinson's medication @ 0230 (Apokyn SQ)

## 2015-06-05 ENCOUNTER — Telehealth: Payer: Self-pay | Admitting: *Deleted

## 2015-06-05 NOTE — Telephone Encounter (Signed)
Copper Katrinka Blazing called she would like to speak with a nurse about patients medication Apokyn Call back number [phone]

## 2015-06-05 NOTE — Telephone Encounter (Signed)
Spoke with patient's girlfriend. She states that patient has recently been in the ER and wanted [place] to be aware. He had been given an Apokyn shot and started having labored breathing. Everything checked out okay in the ER. She took him out of [facility] due to problems with medication management. They have an appt coming up with his PCP to discuss issues with increased anxiety and he also has a follow up with the [place]. They just wanted to make [place] aware. Will call as needed.

## 2015-06-07 ENCOUNTER — Telehealth: Payer: Self-pay | Admitting: Neurology

## 2015-06-07 MED ORDER — TRIMETHOBENZAMIDE HCL 300 MG PO CAPS: 300.0000 mg | ORAL_CAPSULE | Freq: Three times a day (TID) | ORAL | Status: AC

## 2015-06-07 NOTE — Telephone Encounter (Signed)
Pt is needing a refill on "Trineghobenzamibe [phone]

## 2015-06-07 NOTE — Telephone Encounter (Signed)
Called patient and made him aware Trimethobenzamide is only required the first two months of Apokyn therapy. He states that he has been off of it for a few days and having a lot of nausea. Refill of medication sent to pharmacy.

## 2015-06-16 ENCOUNTER — Telehealth: Payer: Self-pay | Admitting: Neurology

## 2015-06-16 MED ORDER — CARBIDOPA-LEVODOPA ER 50-200 MG PO TBCR: 1.0000 | EXTENDED_RELEASE_TABLET | Freq: Every day | ORAL | Status: AC

## 2015-06-16 NOTE — Telephone Encounter (Signed)
Pt called for a refill of "Carbidopa" night time extended release/5263ml/ [phone]

## 2015-06-16 NOTE — Telephone Encounter (Signed)
RX sent to pharmacy  

## 2015-06-20 ENCOUNTER — Ambulatory Visit (INDEPENDENT_AMBULATORY_CARE_PROVIDER_SITE_OTHER): Payer: PPO | Admitting: Neurology

## 2015-06-20 ENCOUNTER — Ambulatory Visit: Payer: PPO | Admitting: Neurology

## 2015-06-20 ENCOUNTER — Encounter: Payer: Self-pay | Admitting: Neurology

## 2015-06-20 VITALS — BP 124/78 | HR 88 | Ht 70.0 in | Wt 225.0 lb

## 2015-06-20 DIAGNOSIS — G20 Parkinson's disease: Secondary | ICD-10-CM

## 2015-06-20 DIAGNOSIS — K5901 Slow transit constipation: Secondary | ICD-10-CM | POA: Diagnosis not present

## 2015-06-20 DIAGNOSIS — G249 Dystonia, unspecified: Secondary | ICD-10-CM | POA: Diagnosis not present

## 2015-06-20 IMAGING — CR DG CHEST 2V
2 series · 2 of 2 positions shown · non-contrast
Comparison: Chest x-rays dated 08/23/2014 and 08/20/2014 and chest
CT dated 10/28/2014

CLINICAL DATA: Cough.  Parkinson's disease.

EXAM:
CHEST  2 VIEW

[x chest ap]
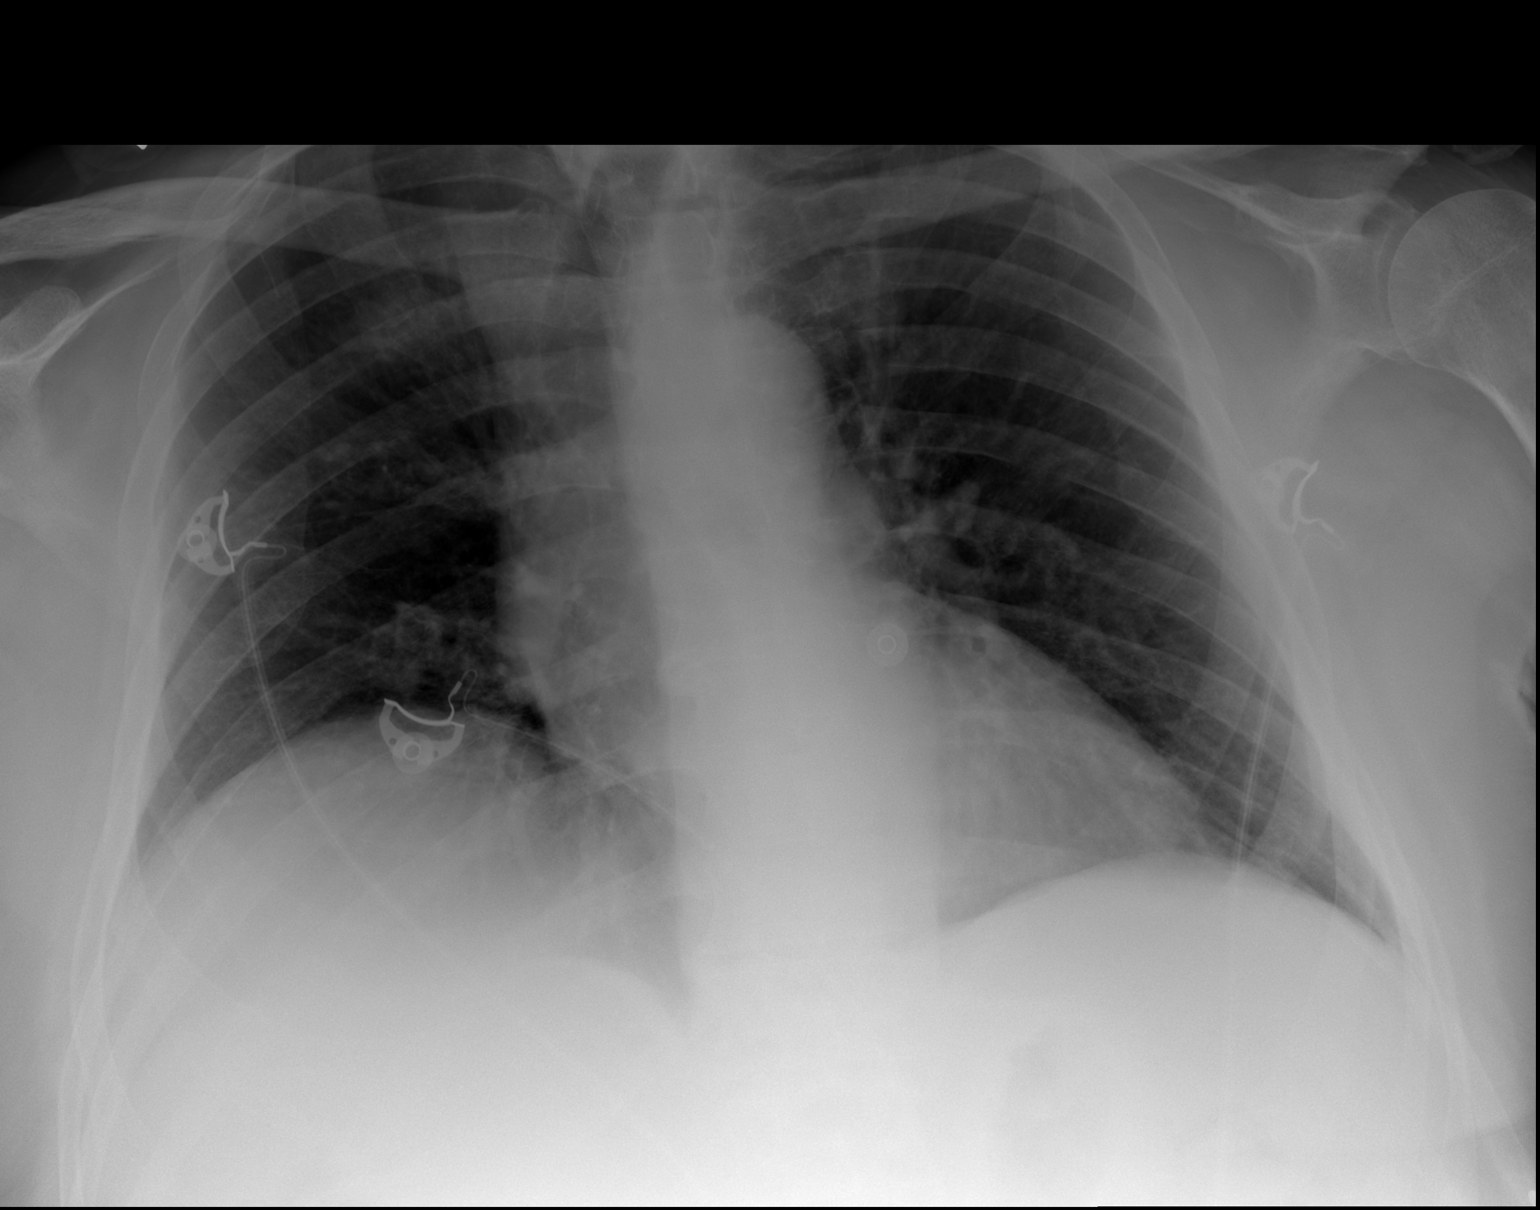

[w chest lat]
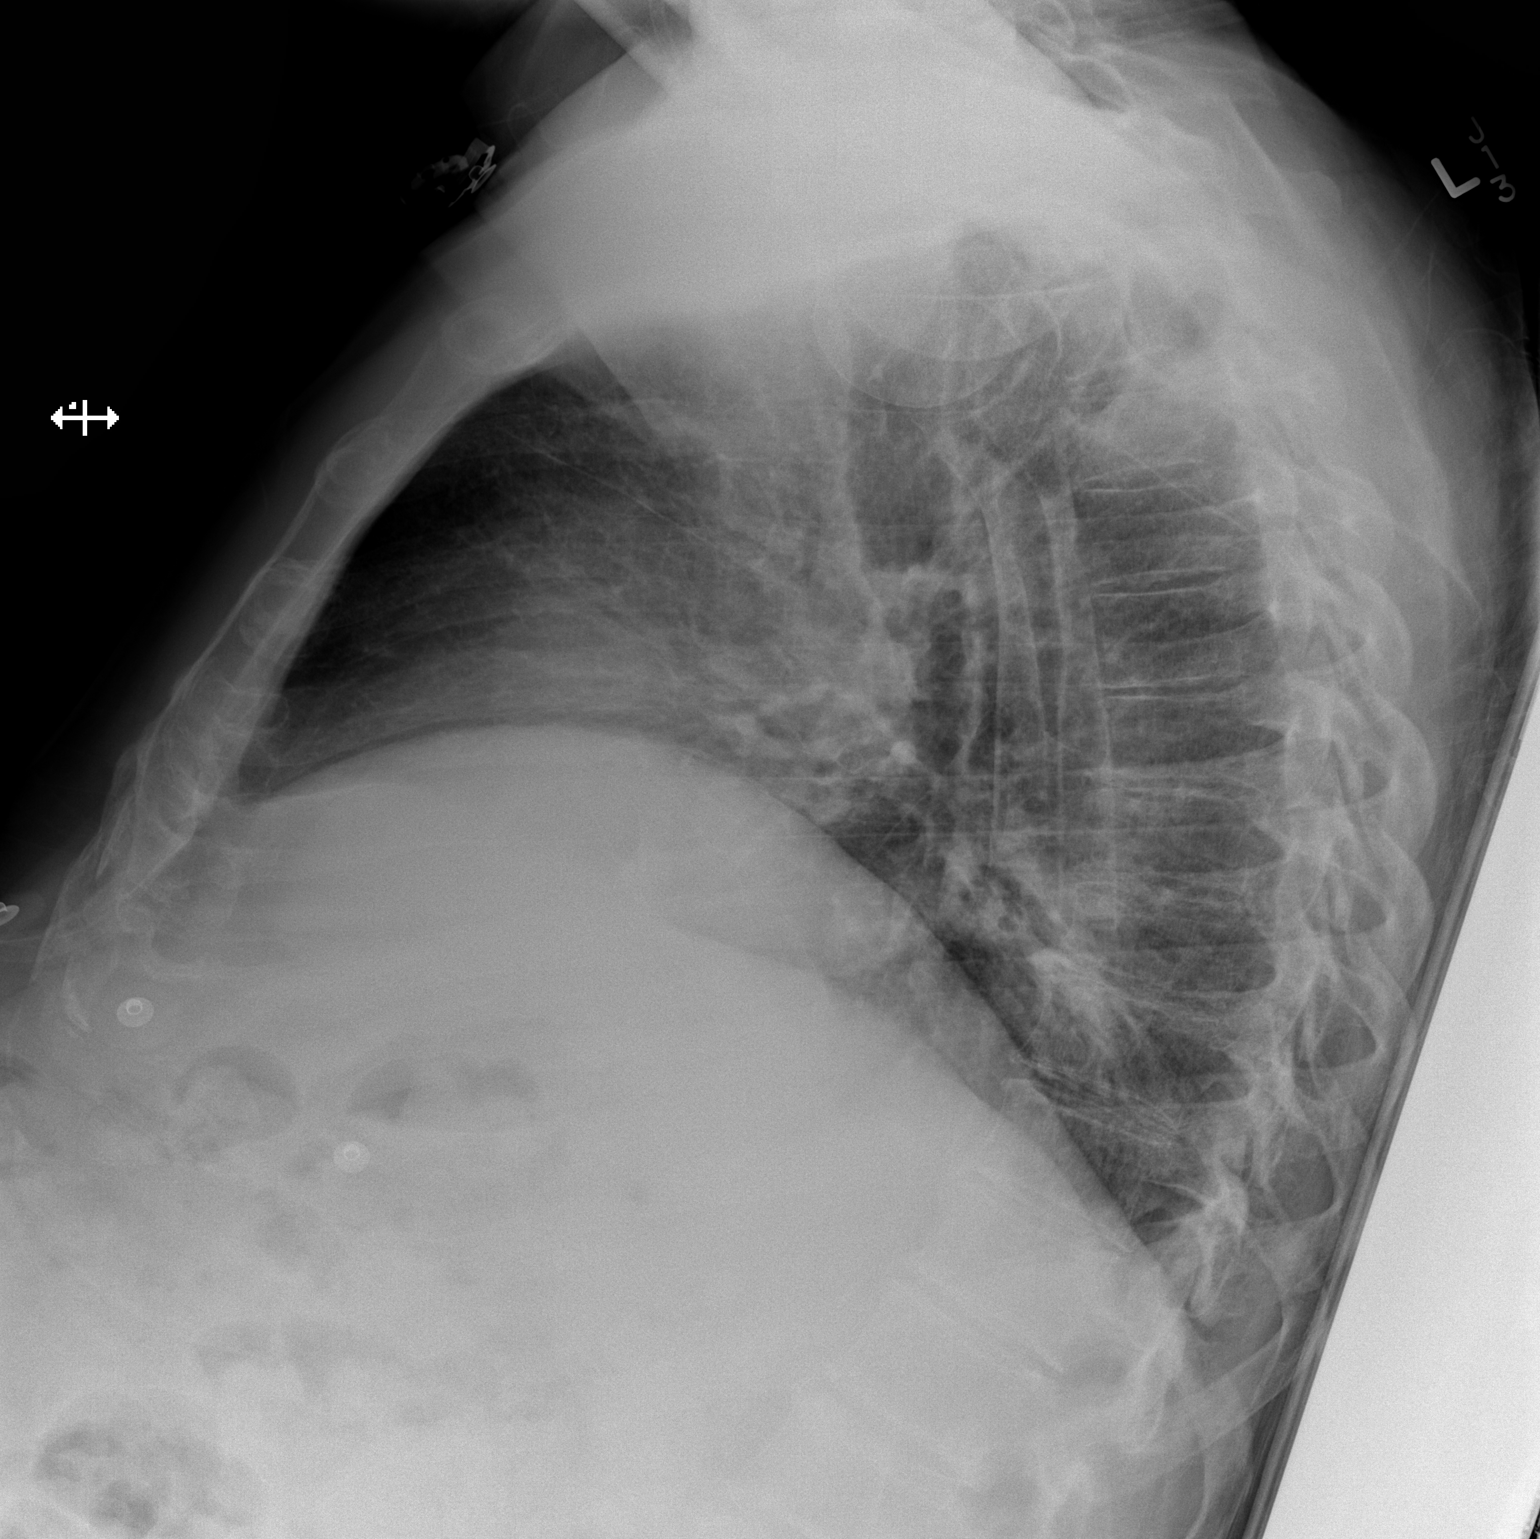

[2 of 2 positions shown; findings below may reference images not displayed]

FINDINGS: Heart size and pulmonary vascularity are normal. The lungs are
clear. No effusions. No significant osseous abnormality. Chronic
slight elevation of the right hemidiaphragm.
IMPRESSION: No active cardiopulmonary disease.

## 2015-06-20 NOTE — Progress Notes (Addendum)
Cody [name] was seen today in the movement disorders clinic for neurologic consultation.  His PCP is [name], MD.  The consultation is for the evaluation of PD.  Prior records from [name] were reviewed.  He was last seen in 01/2013 but apparently had been seen there since 2005 with sx's since 2003.  He has been seen at the [facility] medical center since 2014.    Pt states that in 09/2004 he was noting L leg dragging and he was then referred to [name] and dx with PD.  He developed tremor a few years later.  He was started on requip and titrated up on the drug but he had trouble tolerating it because of EDS and was only taking it at night.  He doesn't think that he was ever on just carbidopa/levodopa and has always been on Stalevo 200 mg.  He is on it 5 times per day.  He takes it at 6am/8:30am/11am/1:30 and 4pm.  He is now on 12 mg qhs of requip.  According to [name] notes, she had tried him on daytime dosages in the past and it made him very sleepy and so he consolidated all of the medication at night.  Therefore, she switched him to Requip XL 12 mg, but when he changed to the [facility] they switched him back to regular ropinirole and while the bottle says 4 mg 3 times a day, the patient states that he was always told to take all 12 mg at night.  He was seen by [name] at the [facility] in [facility] in June and they were going to re-evaluate possibility for DBS in November but then he had an episode of nephrolithiasis and didn't go back.  States that wants to try and get care locally.      He goes to the [employer] faithfully and has gotten around much better with faithful exercise.  01/31/15 update:  The patient is following up today, much sooner than expected.  He is accompanied by his girlfriend who supplements the history.  He has a history of Parkinson's disease with motor fluctuations.  Last visit, multiple recommendations were given but he did not want me to proceed with these  recommendations as he wanted to pursue them at the [facility] because of cost.  He has been taking ropinirole, 12 mg at night because he is unable to tolerate daytime dosages, but I explained to the patient that regular ropinirole was meant to be dosed 3 times per day.  If he needs a dosed once per day then he needs to change to the XL formulation.  He was recently able to change to Requip XL, 6 mg and is taking 2 of them at night.  I also recommended that we add carbidopa/levodopa 50/200 at night to his Stalevo 200 mg that he is already taking at 6 AM/8:30 AM/11 AM/1:30 PM/4 PM.  He states that he accidentally took 6 dosages of Stalevo one day instead of 5 and actually felt great.  He had no freezing that today.  A modified barium swallow was also recommended, buttock and he wished to pursue that as well as an MRI of the brain through the [name].  He had a modified barium swallow scheduled several times, but ended up canceling it because of days that he was not feeling well or was freezing.  He needs to reschedule that.   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  His girlfriend found him on the floor a few days ago and was on the floor a few hours.  He got down to get something and couldn't get back up.  He has not had any falls.  He just has been experiencing more freezing.  They took him to the podiatrist a few days ago and he ended up freezing while in the bathroom and could not get out.  03/31/15 update:  The patient is following up today.  His daughter is with him and supplements the history.  Records were reviewed since last visit.  He went to the ER not long after our last visit complaining of freezing and ultimately was admitted to [facility] extended-care facility.  He is on Stalevo 200 mg at 6 AM/8:30 AM/11 AM/1:30 PM/4 PM/6:30 PM and carbidopa/levodopa 50/200 at night.  States that SNF is sometimes substituting the IR 25/100 at night and giving 2  instead of the 50/200 CR despite him telling them it is not the same.   He is on Requip XL 12 mg daily.  Apokyn was started since last visit.  He is up to 0.3 mg as needed.  Unfortunately, [name] doesn't send his med list today but he states that he is using this 5 times per day.  He has already used it 3 times this AM (it is currently 9:45 AM).  He has to have nursing give it to him as he is generally so frozen that he cannot adminster to himself.  I have wanted him to have an MRI of the brain as well as a modified barium swallow, but he has wanted to have those through the [facility].  Pt states that he had a bedside swallow at [facility].    06/20/15 update:  The patient is following up today, accompanied by his girlfriend who supplements the history.  He has checked himself out of [facility] since our last visit (states been out for 30 days).  Records were reviewed since last visit.  He remains on Requip XL 12 mg daily.  He is on Stalevo 200 mg 7 times a day and carbidopa/levodopa 50/200 at bedtime.  He is using Apokyn which he takes 4-5 times a day (0.4 mg).  He is still needing to prophylaxis with Tigan despite the fact that he has now been on it for quite some time. Despite all of this, he still estimates that he spends 3-6 hours "off" per day.  He went to the emergency room on August 11 complaining of shortness of breath after taking Apokyn in the middle of the night.  However, his blood pressure was not low and in fact it was quite the opposite (147/95) and his oxygen was 95%.  Last visit, we talked extensively about DBS and about duopa, but ultimately the patient was not interested in either.  He has perhaps changed his mind.  States that the apokyn rep told him that DBS wouldn't help him much.   His girlfriend states that his zoloft has been increased to 100 mg, 1 1/2 tablets a day, and that is helping the anxiety, which has helped the freezing some.    Neuroimaging has not previously  been performed.   PREVIOUS MEDICATIONS: Sinemet and Amantadine  (unsure why d/c); azilect (? D/C due to cost); not able to tolerate daytime dosages of requip  ALLERGIES:   Allergies  Allergen Reactions  . Amoxapine And Related Swelling  . Amoxicillin Swelling  . Ceftriaxone Rash  Morbilliform rash on back, flexor creases, chest  . Imipenem Rash    Confluent maculopapular/morbilliform rash on back, arms, chest    CURRENT MEDICATIONS:  Outpatient Encounter Prescriptions as of 06/20/2015  Medication Sig  . APOMORPHINE HYDROCHLORIDE (APOKYN) 10 MG/ML SOLN Inject 0.4 mLs (4 mg total) into the skin daily. 3-5 times daily  Currently on 0.4 mg (titrating)  . atenolol (TENORMIN) 25 MG tablet Take 25 mg by mouth daily.  . carbidopa-levodopa (SINEMET CR) 50-200 MG per tablet Take 1 tablet by mouth at bedtime.  . carbidopa-levodopa (SINEMET IR) 25-100 MG per tablet Take 1 tablet by mouth 5 (five) times daily as needed. Take with Apokyn injection  . carbidopa-levodopa-entacapone (STALEVO) 50-200-200 MG per tablet Take one tablet 7 times daily  . cholecalciferol (VITAMIN D) 1000 UNITS tablet Take 3,000 Units by mouth daily.  Marland Kitchen glipiZIDE (GLUCOTROL) 5 MG tablet Take 5 mg by mouth 2 (two) times daily before a meal.  . lisinopril-hydrochlorothiazide (PRINZIDE,ZESTORETIC) 10-12.5 MG per tablet Take 1 tablet by mouth daily.  . metFORMIN (GLUCOPHAGE) 500 MG tablet Take 1,000 mg by mouth 2 (two) times daily with a meal.  . Probiotic Product (PROBIOTIC PO) Take by mouth daily.  . Ropinirole HCl 12 MG TB24 Take 1 tablet (12 mg total) by mouth daily.  . sertraline (ZOLOFT) 100 MG tablet Take 100 mg by mouth daily.   Marland Kitchen trimethobenzamide (TIGAN) 300 MG capsule Take 1 capsule (300 mg total) by mouth 3 (three) times daily. Start medication 3 days prior to starting Apokyn  . [DISCONTINUED] APOMORPHINE HYDROCHLORIDE (APOKYN) 10 MG/ML SOLN Inject 0.04 mLs (0.4 mg total) into the skin daily. 3-5 times  daily  Currently on 0.4 mg (titrating)  . [DISCONTINUED] rasagiline (AZILECT) 1 MG TABS tablet Take 1 mg by mouth daily.   No facility-administered encounter medications on file as of 06/20/2015.    PAST MEDICAL HISTORY:   Past Medical History  Diagnosis Date  . Back pain     lumbar  . Urgency of urination   . Hammer toe   . Diabetes mellitus   . Unspecified essential hypertension   . Parkinson disease   . Renal stone     PAST SURGICAL HISTORY:   Past Surgical History  Procedure Laterality Date  . Toe surgery Right     big toe  . Toe surgery Left     2nd toe  . Cystoscopy/retrograde/ureteroscopy Right 08/22/2014    Procedure: Cystoscopy, Right Retrograde Pyelogram, Right Ureteral Stent Placement;  Surgeon: Garnett Farm, MD;  Location: [facility];  Service: Urology;  Laterality: Right;    SOCIAL HISTORY:   Social History   Social History  . Marital Status: Widowed    Spouse Name: N/A  . Number of Children: 1  . Years of Education: N/A   Occupational History  . retired     Engineer, structural to [employer]   Social History Main Topics  . Smoking status: Never Smoker   . Smokeless tobacco: Not on file  . Alcohol Use: No  . Drug Use: No  . Sexual Activity: Not on file   Other Topics Concern  . Not on file   Social History Narrative    FAMILY HISTORY:   Family Status  Relation Status Death Age  . Mother Deceased     heart failure, DM  . Father Deceased     stroke  . Daughter Alive     healthy    ROS:  A complete 10 system review of  systems was obtained and was unremarkable apart from what is mentioned above.  PHYSICAL EXAMINATION:    VITALS:   Filed Vitals:   06/20/15 0954  BP: 124/78  Pulse: 88  Height:  (1.778 m)  Weight: 225 lb (102.059 kg)    GEN:  The patient appears stated age and is in NAD. HEENT:  Normocephalic, atraumatic.  The mucous membranes are moist. The superficial temporal arteries are without ropiness or tenderness. CV:   RRR Lungs:  CTAB Neck/HEME:  There are no carotid bruits bilaterally.  Neurological examination:  Orientation: The patient is alert and oriented x3. Fund of knowledge is appropriate.  Recent and remote memory are intact.  Attention and concentration are normal.    Able to name objects and repeat phrases. Cranial nerves: There is good facial symmetry.  There is significant facial hypomimia.   Extraocular muscles are intact. The visual fields are full to confrontational testing. The speech is fluent and clear.  He is mildly hypophonic.  Soft palate rises symmetrically and there is no tongue deviation. Hearing is intact to conversational tone. Sensation: Sensation is intact to light touch throughout. Motor: Strength is 5/5 in the bilateral upper and lower extremities.   Shoulder shrug is equal and symmetric.  There is no pronator drift.   Movement examination: Tone: There is slight increased tone on the L Abnormal movements: none Coordination:  He has difficulty with heel taps and toe taps, L more than right Gait and Station: The patient pushed off the chair and walked down the hall well without freezing.  ASSESSMENT/PLAN:  1.  Idiopathic, akinetic rigid Parkinson's disease, diagnosed in 2005 with symptoms dating back perhaps to 2003  -The patient's Parkinson's disease is now complicated by freezing/motor fluctuations and dyskinesia.    -  He will remain on requip XL 12 mg daily  -He will continue carbidopa/levodopa 50/200 at night.  -I would continue on Stalevo 200 mg 7 times a day.  -He used to be on amantadine but is off of it now.  It appears that perhaps it was discontinued when he went to the [place].  I don't think he had side effects.  He is again having dyskinesia now that he is using Apokyn and we may need it again  -Apokyn is working well.  He is up to Apokyn, 0.4 mg, 4-5 times per day.    -We had another lengthy discussion again about DBS therapy.  We have previously done and on/off  test which showed great efficacy of levodopa.  He and I have discussed this procedure at length along with its risks and benefits.  He asked multiple times if it would help with freezing and I explained to him again that it would only help with the things that levodopa helped with when it was working its best.  He could not really answer in levodopa helped with his freezing.  He did tell me that the Apokyn rep told me he was not a DBS candidate, and I explained to him that the Apokyn rep would not be able to judge this.  In the end, he decided he did not wish to pursue DBS right now.    -The patient does state that he is interested in pursuing duopa.  I talked to him extensively about the risks/benefits of duopa.  He would likely still need to take the Requip orally.  We talked about tube care/risk of infection.  I had him sign paperwork today and will set up an  appointment for him to talk with the duopa rep in the near future and then proceed if he is still interested.  Much greater than 50% of this 45 minute visit was spent in counseling and coordinating care. 2.  Sialorrhea  -Talked about the value of Myobloc.  He is not interested right now, but will let me know if this symptom gets worse. 3.  Dysphagia.  -The patient apparently had a bedside swallow evaluation at [facility].  This is not as good as the modified barium swallow, but he states that he has actually clinically been doing fairly well in this regard.   4.  Constipation  -This is very common with Parkinson's disease.  The patient was given a copy of the rancho recipe. 5.  Obstructive sleep apnea syndrome  -The patient is having difficulty tolerating CPAP.  I encouraged him to continue to try to use CPAP.  We talked about morbidity and mortality associated with untreated sleep apnea

## 2015-06-21 ENCOUNTER — Telehealth: Payer: Self-pay | Admitting: Neurology

## 2015-06-21 NOTE — Telephone Encounter (Signed)
Per Tandy Gaw, rep with Duopa, The best of action would be to have the patient contact Duopa nurse, Hubert Azure [phone]. Also the peer mentor line where he can talk to a current patient on therapy [phone].   Information given to patient. He would like [place] to go ahead a complete paperwork to send the [employer] to start a benefit investigation to see how much the medication would cost patient. Will work on completing paperwork.

## 2015-07-03 ENCOUNTER — Other Ambulatory Visit: Payer: Self-pay | Admitting: *Deleted

## 2015-07-03 MED ORDER — ROPINIROLE HCL ER 12 MG PO TB24: 1.0000 | ORAL_TABLET | Freq: Every day | ORAL | Status: AC

## 2015-07-03 NOTE — Telephone Encounter (Signed)
Patient requesting a refill 

## 2015-07-03 NOTE — Telephone Encounter (Signed)
Requip refill requested. Per last office note- patient to remain on medication. Refill approved and sent to patient's pharmacy.   

## 2015-07-19 ENCOUNTER — Telehealth: Payer: Self-pay | Admitting: Neurology

## 2015-07-19 DIAGNOSIS — G20 Parkinson's disease: Secondary | ICD-10-CM

## 2015-07-19 NOTE — Telephone Encounter (Signed)
Appt made with IR - [facility] on 08/01/2015 at 1:00 pm to arrive at 12:45 pm. Excell Seltzer made aware.

## 2015-07-19 NOTE — Telephone Encounter (Signed)
Peggy from [facility]/ called for a referral for a Med/Duopa//call back @ [phone]/ ext. [phone]

## 2015-07-19 NOTE — Telephone Encounter (Signed)
Spoke with Excell Seltzer and made her aware this would more than likely not be covered by patient's insurance. She will reach out to [employer]. Made her aware we have not heard back from the patient re: his decision on the duopa pump. She states he does want this procedure. Called Vickie at [facility] at [phone] and [facility] inquiring about referral to have pump inserted. Awaiting call back.

## 2015-07-19 NOTE — Telephone Encounter (Signed)
Manson Allan called in regards to PT, she has been staying with him while waiting for his pump to arrive and she was wanting to know if Dr Tat could refer to have someone come and stay with him at night so she can go home and get some rest/Dawn CB# [phone]

## 2015-07-21 ENCOUNTER — Telehealth: Payer: Self-pay | Admitting: Neurology

## 2015-07-21 NOTE — Telephone Encounter (Signed)
Spoke with [facility] to give them current Levodopa dosages. They are calculating dosage of duopa based on this and will fax over RX.

## 2015-07-21 NOTE — Telephone Encounter (Signed)
[facility] called in regards to PT's medication duopa and would like a call back/Dawn CB# [phone]/[phone],Opt3

## 2015-07-21 NOTE — Telephone Encounter (Signed)
Order signed by Dr Tat and faxed to [facility] at [phone] with confirmation received.

## 2015-07-27 ENCOUNTER — Telehealth: Payer: Self-pay | Admitting: Neurology

## 2015-07-27 NOTE — Telephone Encounter (Signed)
Spoke with patient and he states 3-6 hours daily of off time. Dr Tat will addend her note then we will send to [facility].

## 2015-07-27 NOTE — Telephone Encounter (Signed)
Left message on machine for patient to call back. To see how many hours of "off" time per day he is experiencing. Awaiting call back.

## 2015-07-27 NOTE — Telephone Encounter (Signed)
Addended note sent to [facility] at [phone] with confirmation received.

## 2015-07-28 ENCOUNTER — Telehealth: Payer: Self-pay | Admitting: Neurology

## 2015-07-28 NOTE — Telephone Encounter (Signed)
VM-Peggy from [facility] called in regards to an update in  Pt's medication/Dawn CB# [phone] [id number]

## 2015-07-31 NOTE — Telephone Encounter (Signed)
Called Cody [name] back and left message for her to call me back.

## 2015-08-01 ENCOUNTER — Ambulatory Visit
Admission: RE | Admit: 2015-08-01 | Discharge: 2015-08-01 | Disposition: A | Discharge status: Home / Self Care | Payer: PPO | Source: Ambulatory Visit | Attending: Neurology | Admitting: Neurology

## 2015-08-01 DIAGNOSIS — G20 Parkinson's disease: Secondary | ICD-10-CM | POA: Insufficient documentation

## 2015-08-01 NOTE — Telephone Encounter (Signed)
Has she called you?

## 2015-08-01 NOTE — Telephone Encounter (Signed)
Nope. Left another message for her to call back if she needs [place].

## 2015-08-01 NOTE — Consult Note (Signed)
Chief Complaint: Patient was seen in consultation today for placement of a GJ tube.  At the request of Tat,Rebecca S  Referring Physician(s): Tat,Rebecca S  History of Present Illness:  Cody [name] is a 69 y.o. male with Parkinson's disease. Patient was referred to the Interventional Radiology because his neurologist would like to start Duopa treatment which requires infusion through a GJ tube. Review of symptoms was remarkable for constipation and occasionally having difficulty breathing. Patient has a diagnosis of obstructive sleep apnea syndrome. At today's visit, the patient is having multiple involuntary movements with his extremities, according to the patient's caretaker this is medication related and usually he has problems with being frozen. He was diagnosed with paralysis of the right hemidiaphragm. No surgical history that appears to present a problem for GJ tube placement. The patient does not have any significant dysphasia or problems swallowing.  Past Medical History  Diagnosis Date  . Back pain     lumbar  . Urgency of urination   . Hammer toe   . Diabetes mellitus   . Unspecified essential hypertension   . Parkinson disease   . Renal stone     Past Surgical History  Procedure Laterality Date  . Toe surgery Right     big toe  . Toe surgery Left     2nd toe  . Cystoscopy/retrograde/ureteroscopy Right 08/22/2014    Procedure: Cystoscopy, Right Retrograde Pyelogram, Right Ureteral Stent Placement;  Surgeon: Garnett Farm, MD;  Location: [facility];  Service: Urology;  Laterality: Right;    Allergies: Amoxapine and related; Amoxicillin; Ceftriaxone; and Imipenem  Medications: Prior to Admission medications   Medication Sig Start Date End Date Taking? Authorizing Provider  APOMORPHINE HYDROCHLORIDE (APOKYN) 10 MG/ML SOLN Inject 0.4 mLs (4 mg total) into the skin daily. 3-5 times daily  Currently on 0.4 mg (titrating) 05/02/15  Yes Rebecca S Tat, DO    carbidopa-levodopa (SINEMET CR) 50-200 MG per tablet Take 1 tablet by mouth at bedtime. 06/16/15  Yes Rebecca S Tat, DO  carbidopa-levodopa (SINEMET IR) 25-100 MG per tablet Take 1 tablet by mouth 5 (five) times daily as needed. Take with Apokyn injection 03/31/15  Yes Rebecca S Tat, DO  carbidopa-levodopa-entacapone (STALEVO) 50-200-200 MG per tablet Take one tablet 7 times daily 03/31/15  Yes Rebecca S Tat, DO  cholecalciferol (VITAMIN D) 1000 UNITS tablet Take 3,000 Units by mouth daily.   Yes Historical Provider, MD  lisinopril-hydrochlorothiazide (PRINZIDE,ZESTORETIC) 10-12.5 MG per tablet Take 1 tablet by mouth daily.   Yes Historical Provider, MD  metFORMIN (GLUCOPHAGE) 500 MG tablet Take 1,000 mg by mouth 2 (two) times daily with a meal.   Yes Historical Provider, MD  Probiotic Product (PROBIOTIC PO) Take by mouth daily.   Yes Historical Provider, MD  Ropinirole HCl 12 MG TB24 Take 1 tablet (12 mg total) by mouth daily. 07/03/15  Yes Rebecca S Tat, DO  sertraline (ZOLOFT) 100 MG tablet Take 100 mg by mouth daily.    Yes Historical Provider, MD  trimethobenzamide (TIGAN) 300 MG capsule Take 1 capsule (300 mg total) by mouth 3 (three) times daily. Start medication 3 days prior to starting Apokyn 06/07/15  Yes Rebecca S Tat, DO  atenolol (TENORMIN) 25 MG tablet Take 25 mg by mouth daily.    Historical Provider, MD  glipiZIDE (GLUCOTROL) 5 MG tablet Take 5 mg by mouth 2 (two) times daily before a meal.    Historical Provider, MD     Family History  Problem Relation Age of Onset  . High blood pressure Mother   . Diabetes Mother   . Stroke Father     Social History   Social History  . Marital Status: Widowed    Spouse Name: N/A  . Number of Children: 1  . Years of Education: N/A   Occupational History  . retired     Engineer, structural to [employer]   Social History Main Topics  . Smoking status: Never Smoker   . Smokeless tobacco: Not on file  . Alcohol Use: No  . Drug Use: No  . Sexual  Activity: Not on file   Other Topics Concern  . Not on file   Social History Narrative     Review of Systems  Respiratory: Positive for shortness of breath.   Cardiovascular: Negative.   Gastrointestinal: Positive for constipation.  Genitourinary: Positive for frequency.    Vital Signs: BP 125/62 mmHg  Pulse 73  Temp(Src) 97.8 F (36.6 C) (Oral)  Resp 14  Ht  (1.778 m)  Wt 224 lb (101.606 kg)  BMI 32.14 kg/m2  SpO2 95%  Physical Exam  Constitutional: He is oriented to person, place, and time. He appears well-developed.  Continuous involuntary movements.  Cardiovascular: Normal rate and regular rhythm.   Murmur heard. Systolic murmur  Pulmonary/Chest: Effort normal.  Faint breath sounds bilaterally  Abdominal: Soft. Bowel sounds are normal. He exhibits no distension. There is no tenderness.  Neurological: He is alert and oriented to person, place, and time.     Imaging: No results found.  Labs:  CBC:  Recent Labs  08/24/14 0503 08/25/14 0540 02/08/15 1040 06/01/15 0650  WBC 13.6* 12.4* 5.9 6.9  HGB 14.5 13.8 13.8 14.6  HCT 42.1 40.1 41.7 43.2  PLT 93* 105* 212 220    COAGS:  Recent Labs  08/21/14 1130  INR 1.39  APTT 37    BMP:  Recent Labs  08/24/14 0503 08/25/14 0540 02/08/15 1040 06/01/15 0650  NA 131* 130* 133* 134*  K 3.9 3.8 4.2 4.3  CL 93* 92* 102 98*  CO2 GLUCOSE 187* 192* 158* 142*  BUN 32* 35* 32* 21*  CALCIUM 8.6 8.8 9.0 9.7  CREATININE 1.53* 1.45* 1.17 1.00  GFRNONAA 45* 48* 62* >60  GFRAA 52* 55* 72* >60    LIVER FUNCTION TESTS:  Recent Labs  08/20/14 2003 08/21/14 0723 02/08/15 1040  BILITOT 1.1 1.0 0.8  AST 27 27 39*  ALT ALKPHOS 68 65 53  PROT 7.5 6.0 7.7  ALBUMIN 4.1 3.3* 4.5    TUMOR MARKERS: No results for input(s): AFPTM, CEA, CA199, CHROMGRNA in the last 8760 hours.  Assessment and Plan:   69 year old with Parkinson's disease and planning to start Duopa therapy.   This therapy requires a GJ tube for infusion. I explained placement of a GJ tube with the patient and his caretaker in depth. I believe they understand the risks of the benefits which include bleeding, bowel injury and infection. I reviewed a prior CT of the abdomen from 08/20/2014. I do not see any anatomic problems with placement of a GJ tube. The right hemidiaphragm is elevated and compatible history of a paralysis. For that reason, I believe the patient should drink oral barium the night before in order to opacify the transverse colon.  Patient is a candidate for GJ tube placement. I have contacted the vendor who provides the GJ tubes for the Duopa therapy. We  will schedule the patient for GJ tube placement in the upcoming weeks.  Thank you for this interesting consult.  I greatly enjoyed meeting Flora R Juma and look forward to participating in their care.  A copy of this report was sent to the requesting provider on this date.  SignedAbundio Miu 08/01/2015, 4:02 PM   I spent a total of  15 Minutes in face to face in clinical consultation, greater than 50% of which was counseling/coordinating care for GJ tube placement.

## 2015-08-07 ENCOUNTER — Telehealth: Payer: Self-pay | Admitting: Neurology

## 2015-08-07 MED ORDER — CARBIDOPA-LEVODOPA-ENTACAPONE 50-200-200 MG PO TABS: ORAL_TABLET | ORAL | Status: AC

## 2015-08-07 NOTE — Telephone Encounter (Signed)
Stalevo refill requested. Per last office note- patient to remain on medication. Refill approved and sent to patient's pharmacy per patient request.

## 2015-08-07 NOTE — Telephone Encounter (Signed)
[name] called in regards to PT and needing a refill of Carbidopa-Levodopa called in/Dawn CB# [phone]

## 2015-08-08 ENCOUNTER — Other Ambulatory Visit (HOSPITAL_COMMUNITY): Payer: Self-pay | Admitting: Diagnostic Radiology

## 2015-08-08 ENCOUNTER — Telehealth: Payer: Self-pay | Admitting: Neurology

## 2015-08-08 ENCOUNTER — Telehealth (HOSPITAL_COMMUNITY): Payer: Self-pay | Admitting: Diagnostic Radiology

## 2015-08-08 DIAGNOSIS — G20 Parkinson's disease: Secondary | ICD-10-CM

## 2015-08-08 NOTE — Telephone Encounter (Signed)
Called pt, left VM for him to call back to schedule his procedure with Dr. Lowella DandyHenn. JM

## 2015-08-08 NOTE — Telephone Encounter (Signed)
VM-Jristen with [facility] called in regards to PT and his medication Sinemet/Dawn CB# [phone]

## 2015-08-09 MED ORDER — CARBIDOPA-LEVODOPA 25-100 MG PO TABS: 1.0000 | ORAL_TABLET | Freq: Every day | ORAL | Status: AC | PRN

## 2015-08-09 NOTE — Telephone Encounter (Signed)
Carbidopa Levodopa 25/100 refill requested. Per last office note- patient to remain on medication. Refill approved and sent to patient's pharmacy.   

## 2015-08-09 NOTE — Telephone Encounter (Signed)
Pt care giver called cooper smith and states patient needs a refill on his medication levodopa 25/100 quick release pt phone number  [phone]

## 2015-08-09 NOTE — Telephone Encounter (Signed)
Called [facility] and they state Duopa needs prior auth. They will call back with information about who to call or send any forms at need completed to me.

## 2015-08-10 ENCOUNTER — Telehealth: Payer: Self-pay | Admitting: Neurology

## 2015-08-10 NOTE — Telephone Encounter (Signed)
Pt/ called to inform that the Tube Placement is scheduled for/ 08/21/15//and that they haven't heard from the drug company yet/call back @ [phone]

## 2015-08-18 ENCOUNTER — Other Ambulatory Visit: Payer: Self-pay | Admitting: Radiology

## 2015-08-18 ENCOUNTER — Other Ambulatory Visit: Payer: Self-pay | Admitting: Physician Assistant

## 2015-08-21 ENCOUNTER — Ambulatory Visit (HOSPITAL_COMMUNITY)
Admission: RE | Admit: 2015-08-21 | Discharge: 2015-08-21 | Disposition: A | Discharge status: Home / Self Care | Payer: PPO | Source: Ambulatory Visit | Attending: Diagnostic Radiology | Admitting: Diagnostic Radiology

## 2015-08-21 DIAGNOSIS — G20 Parkinson's disease: Secondary | ICD-10-CM | POA: Diagnosis not present

## 2015-08-21 LAB — GLUCOSE, CAPILLARY: GLUCOSE-CAPILLARY: 170 mg/dL — AB (ref 65–99)

## 2015-08-23 ENCOUNTER — Telehealth: Payer: Self-pay | Admitting: Neurology

## 2015-08-23 NOTE — Telephone Encounter (Signed)
Pt/care giver Cody [name]/called concerning the intro tube duropa pump?//call back @ [phone]

## 2015-08-24 NOTE — Telephone Encounter (Signed)
Spoke with [employer].

## 2015-08-29 ENCOUNTER — Telehealth: Payer: Self-pay | Admitting: Neurology

## 2015-08-29 NOTE — Telephone Encounter (Signed)
[name] called in regards to PT's medication duopa and trying to get it/Dawn CB# [phone]

## 2015-08-29 NOTE — Telephone Encounter (Signed)
Spoke with [employer]. They have applied for patient assistance for Duopa but are waiting to hear back. They will contact us when/if they want to proceed.

## 2015-08-29 NOTE — Telephone Encounter (Signed)
Cody [name] CALLED AND STATES THAT THE PATIENT COPAY IS 30 THOUSAND A YEAR [phone]

## 2015-08-29 NOTE — Telephone Encounter (Signed)
This sounds ridiculous.  What does our duopa rep say?

## 2015-08-29 NOTE — Telephone Encounter (Signed)
Spoke with [employer]. Duopa rep emailed.

## 2015-08-30 ENCOUNTER — Telehealth: Payer: Self-pay | Admitting: Neurology

## 2015-08-30 NOTE — Telephone Encounter (Signed)
Tandy Gawhris Vocke, Duopa rep made aware, and will proceed when he lets me know that correct tubing is available at interventional radiology.

## 2015-08-30 NOTE — Telephone Encounter (Signed)
VM-Cooper Katrinka Blazingsmith called and left a message saying it was a go for therapy/Dawn CB# [phone]

## 2015-08-30 NOTE — Telephone Encounter (Signed)
He is checking on it.

## 2015-09-01 ENCOUNTER — Encounter: Payer: Self-pay | Admitting: Neurology

## 2015-09-03 ENCOUNTER — Emergency Department (HOSPITAL_COMMUNITY): Payer: PPO

## 2015-09-03 ENCOUNTER — Emergency Department (HOSPITAL_COMMUNITY)
Admission: EM | Admit: 2015-09-03 | Discharge: 2015-09-03 | Disposition: A | Discharge status: Home / Self Care | Payer: PPO | Attending: Emergency Medicine | Admitting: Emergency Medicine

## 2015-09-03 ENCOUNTER — Encounter (HOSPITAL_COMMUNITY): Payer: Self-pay | Admitting: Emergency Medicine

## 2015-09-03 DIAGNOSIS — Z87442 Personal history of urinary calculi: Secondary | ICD-10-CM | POA: Diagnosis not present

## 2015-09-03 DIAGNOSIS — E119 Type 2 diabetes mellitus without complications: Secondary | ICD-10-CM | POA: Insufficient documentation

## 2015-09-03 DIAGNOSIS — R2242 Localized swelling, mass and lump, left lower limb: Secondary | ICD-10-CM | POA: Insufficient documentation

## 2015-09-03 DIAGNOSIS — R5383 Other fatigue: Secondary | ICD-10-CM | POA: Diagnosis not present

## 2015-09-03 DIAGNOSIS — Z8739 Personal history of other diseases of the musculoskeletal system and connective tissue: Secondary | ICD-10-CM | POA: Insufficient documentation

## 2015-09-03 DIAGNOSIS — G20 Parkinson's disease: Secondary | ICD-10-CM | POA: Diagnosis not present

## 2015-09-03 DIAGNOSIS — Z79899 Other long term (current) drug therapy: Secondary | ICD-10-CM | POA: Diagnosis not present

## 2015-09-03 DIAGNOSIS — R350 Frequency of micturition: Secondary | ICD-10-CM | POA: Insufficient documentation

## 2015-09-03 DIAGNOSIS — I10 Essential (primary) hypertension: Secondary | ICD-10-CM | POA: Diagnosis not present

## 2015-09-03 DIAGNOSIS — Z880 Allergy status to penicillin: Secondary | ICD-10-CM | POA: Insufficient documentation

## 2015-09-03 DIAGNOSIS — R0602 Shortness of breath: Secondary | ICD-10-CM | POA: Insufficient documentation

## 2015-09-03 LAB — BASIC METABOLIC PANEL
ANION GAP: 10 (ref 5–15)
BUN: 22 mg/dL — ABNORMAL HIGH (ref 6–20)
CALCIUM: 9.4 mg/dL (ref 8.9–10.3)
CHLORIDE: 101 mmol/L (ref 101–111)
CO2: 26 mmol/L (ref 22–32)
Creatinine, Ser: 0.92 mg/dL (ref 0.61–1.24)
GFR calc non Af Amer: >60 mL/min (ref >60)
GLUCOSE: 118 mg/dL — AB (ref 65–99)
Potassium: 4.1 mmol/L (ref 3.5–5.1)
Sodium: 137 mmol/L (ref 135–145)

## 2015-09-03 LAB — CBC
HEMATOCRIT: 40.8 % (ref 39.0–52.0)
HEMOGLOBIN: 14 g/dL (ref 13.0–17.0)
MCH: 29.9 pg (ref 26.0–34.0)
MCHC: 34.3 g/dL (ref 30.0–36.0)
MCV: 87 fL (ref 78.0–100.0)
Platelets: 205 10*3/uL (ref 150–400)
RBC: 4.69 MIL/uL (ref 4.22–5.81)
RDW: 12.9 % (ref 11.5–15.5)
WBC: 7.7 10*3/uL (ref 4.0–10.5)

## 2015-09-03 LAB — URINALYSIS, ROUTINE W REFLEX MICROSCOPIC
Bilirubin Urine: NEGATIVE
GLUCOSE, UA: 100 mg/dL — AB
HGB URINE DIPSTICK: NEGATIVE
Ketones, ur: NEGATIVE mg/dL
LEUKOCYTES UA: NEGATIVE
Nitrite: NEGATIVE
PH: 6.5 (ref 5.0–8.0)
PROTEIN: NEGATIVE mg/dL
SPECIFIC GRAVITY, URINE: 1.017 (ref 1.005–1.030)
Urobilinogen, UA: 0.2 mg/dL (ref 0.0–1.0)

## 2015-09-03 MED ORDER — LORAZEPAM 2 MG/ML IJ SOLN
1.0000 mg | Freq: Once | INTRAMUSCULAR | Status: AC
  Administered 2015-09-03: 1 mg via INTRAVENOUS
  Filled 2015-09-03: qty 1

## 2015-09-03 NOTE — ED Provider Notes (Signed)
CSN: [id number]     Arrival date & time 09/03/15  2145 History   First MD Initiated Contact with Patient 09/03/15 2218     Chief Complaint  Patient presents with  . Parkinson's Disease   . Shortness of Breath  . Weakness     (Consider location/radiation/quality/duration/timing/severity/associated sxs/prior Treatment) HPI Comments: Patient presents for shortness of breath. He has a history of Parkinson's. He's on Sinemet. He is currently in the process of being started on a medication as well. He states that tonight he feels like his muscles are more frozen up than normal. He feels like his chest is frozen up and it causes him to have a sensation of shortness of breath. He states this is happened in the past. He has a little bit of intermittent coughing. He denies any chest pain. There is no pleuritic symptoms. He has some chronic swelling of his left leg as compared to the right but it's unchanged from his baseline. He denies any leg pain. He denies any known fevers. There is no nausea or vomiting. He also has some urinary frequency which he states also happens "when he locks up".  Patient is a 69 y.o. male presenting with shortness of breath and weakness.  Shortness of Breath Associated symptoms: cough   Associated symptoms: no abdominal pain, no chest pain, no diaphoresis, no fever, no headaches, no rash and no vomiting   Weakness Associated symptoms include shortness of breath. Pertinent negatives include no chest pain, no abdominal pain and no headaches.    Past Medical History  Diagnosis Date  . Back pain     lumbar  . Urgency of urination   . Hammer toe   . Diabetes mellitus (HCC)   . Unspecified essential hypertension   . Parkinson disease (HCC)   . Renal stone    Past Surgical History  Procedure Laterality Date  . Toe surgery Right     big toe  . Toe surgery Left     2nd toe  . Cystoscopy/retrograde/ureteroscopy Right 08/22/2014    Procedure: Cystoscopy, Right  Retrograde Pyelogram, Right Ureteral Stent Placement;  Surgeon: Garnett Farm, MD;  Location: [facility];  Service: Urology;  Laterality: Right;   Family History  Problem Relation Age of Onset  . High blood pressure Mother   . Diabetes Mother   . Stroke Father    Social History  Substance Use Topics  . Smoking status: Never Smoker   . Smokeless tobacco: None  . Alcohol Use: No    Review of Systems  Constitutional: Positive for fatigue. Negative for fever, chills and diaphoresis.  HENT: Negative for congestion, rhinorrhea and sneezing.   Eyes: Negative.   Respiratory: Positive for cough and shortness of breath. Negative for chest tightness.   Cardiovascular: Negative for chest pain and leg swelling.  Gastrointestinal: Negative for nausea, vomiting, abdominal pain, diarrhea and blood in stool.  Genitourinary: Positive for frequency. Negative for hematuria, flank pain and difficulty urinating.  Musculoskeletal: Negative for back pain and arthralgias.  Skin: Negative for rash.  Neurological: Negative for dizziness, speech difficulty, weakness, numbness and headaches.      Allergies  Amoxapine and related; Amoxicillin; Ceftriaxone; and Imipenem  Home Medications   Prior to Admission medications   Medication Sig Start Date End Date Taking? Authorizing Provider  APOMORPHINE HYDROCHLORIDE (APOKYN) 10 MG/ML SOLN Inject 0.4 mLs (4 mg total) into the skin daily. 3-5 times daily  Currently on 0.4 mg (titrating) 05/02/15  Yes Octaviano Batty Tat, DO  carbidopa-levodopa (SINEMET CR) 50-200 MG per tablet Take 1 tablet by mouth at bedtime. 06/16/15  Yes Rebecca S Tat, DO  carbidopa-levodopa (SINEMET IR) 25-100 MG tablet Take 1 tablet by mouth 5 (five) times daily as needed. Take with Apokyn injection 08/09/15  Yes Rebecca S Tat, DO  carbidopa-levodopa-entacapone (STALEVO) 50-200-200 MG tablet Take one tablet 7 times daily 08/07/15  Yes Rebecca S Tat, DO  cholecalciferol (VITAMIN D) 1000 UNITS tablet  Take 3,000 Units by mouth daily.   Yes Historical Provider, MD  lisinopril-hydrochlorothiazide (PRINZIDE,ZESTORETIC) 10-12.5 MG per tablet Take 1 tablet by mouth daily.   Yes Historical Provider, MD  metFORMIN (GLUCOPHAGE) 500 MG tablet Take 1,000 mg by mouth 2 (two) times daily with a meal.   Yes Historical Provider, MD  Probiotic Product (PROBIOTIC PO) Take 1 tablet by mouth daily.    Yes Historical Provider, MD  Ropinirole HCl 12 MG TB24 Take 1 tablet (12 mg total) by mouth daily. 07/03/15  Yes Rebecca S Tat, DO  sertraline (ZOLOFT) 100 MG tablet Take 150 mg by mouth daily.    Yes Historical Provider, MD  trimethobenzamide (TIGAN) 300 MG capsule Take 1 capsule (300 mg total) by mouth 3 (three) times daily. Start medication 3 days prior to starting Apokyn 06/07/15  Yes Rebecca S Tat, DO   BP 114/62 mmHg  Pulse 78  Temp(Src) 97.4 F (36.3 C) (Oral)  Resp 17  SpO2 95% Physical Exam  Constitutional: He is oriented to person, place, and time. He appears well-developed and well-nourished.  HENT:  Head: Normocephalic and atraumatic.  Mouth/Throat: Oropharynx is clear and moist.  Eyes: Pupils are equal, round, and reactive to light.  Neck: Normal range of motion. Neck supple.  Cardiovascular: Normal rate, regular rhythm and normal heart sounds.   Pulmonary/Chest: Effort normal and breath sounds normal. No respiratory distress. He has no wheezes. He has no rales. He exhibits no tenderness.  Abdominal: Soft. Bowel sounds are normal. There is no tenderness. There is no rebound and no guarding.  Musculoskeletal: Normal range of motion. He exhibits edema.  Mild swelling of the left leg as compared to the right. No calf tenderness.  Lymphadenopathy:    He has no cervical adenopathy.  Neurological: He is alert and oriented to person, place, and time.  Skin: Skin is warm and dry. No rash noted.  Psychiatric: He has a normal mood and affect.    ED Course  Procedures (including critical care  time) Labs Review Labs Reviewed  BASIC METABOLIC PANEL - Abnormal; Notable for the following:    Glucose, Bld 118 (*)    BUN 22 (*)    All other components within normal limits  URINALYSIS, ROUTINE W REFLEX MICROSCOPIC (NOT AT [facility]) - Abnormal; Notable for the following:    Glucose, UA 100 (*)    All other components within normal limits  CBC    Imaging Review Dg Chest 2 View  09/03/2015  CLINICAL DATA:  Acute onset of shortness of breath. Parkinson's disease exacerbation. Initial encounter. EXAM: CHEST  2 VIEW COMPARISON:  Chest radiograph performed 06/01/2015 FINDINGS: There is elevation of the right hemidiaphragm. There is no evidence of focal opacification, pleural effusion or pneumothorax. The heart is borderline normal in size. No acute osseous abnormalities are seen. IMPRESSION: Elevation of the right hemidiaphragm.  Lungs remain grossly clear. Electronically Signed   By: Roanna Raider M.D.   On: 09/03/2015 23:02   I have personally reviewed and evaluated these images and lab results as  part of my medical decision-making.   EKG Interpretation   Date/Time:  Sunday September 03 2015 22:04:06 EST Ventricular Rate:  81 PR Interval:  205 QRS Duration: 108 QT Interval:  364 QTC Calculation: 422 R Axis:   -51 Text Interpretation:  Sinus rhythm Left anterior fascicular block Left  ventricular hypertrophy Anterior Q waves, possibly due to LVH Nonspecific  T abnormalities, lateral leads similar to prior tracings.  T wave  inversion laterally was noted on EKG from 08/21/14 Confirmed by Danille Oppedisano  MD,  Cornelious Diven ([id number]) on 09/03/2015 10:57:58 PM      MDM   Final diagnoses:  Shortness of breath    Patient presents with shortness of breath. He seems to have an anxiety component. He feels like his muscles are all tightened up. There is no evidence of pneumonia. He doesn't have symptoms that would be more suggestive of PE. He doesn't have any symptoms that would be more suggestive of  acute coronary syndrome. His family states he is not sleeping well. He was given 1 dose of Ativan in the ED and he states he feels better. He is a bit more drowsy. His oxygen saturations are maintaining over 94% on room air. The family has arranged for another family member to stay with the patient at home tonight. I advised him to follow-up with his physician and return here as needed for any worsening symptoms.    Rolan BuccoMelanie Izea Livolsi, MD 09/03/15 [phone]

## 2015-09-03 NOTE — ED Notes (Signed)
Pt transported to XRAY °

## 2015-09-03 NOTE — ED Notes (Signed)
MD Belfi at bedside. 

## 2015-09-03 NOTE — ED Notes (Signed)
MD at bedside. 

## 2015-09-03 NOTE — ED Notes (Signed)
Pt alert and oriented and wheeled to the vehicle by this RN. They were advised to follow up with PCP as needed.

## 2015-09-03 NOTE — ED Notes (Signed)
This RN and Amy RN assisted patient with use of urinal.

## 2015-09-03 NOTE — Discharge Instructions (Signed)

## 2015-09-03 NOTE — ED Notes (Signed)
Pt's family reports he was "freezing up" tonight more so than usual. Has had Parkinson's for 11 years. Takes max amount of Carbidopa-Levidopa. Was supposed to "have a catheter put in for medication administration but they messed it up." Also c/o SOB d/t having "a muscle that doesn't contribute." Says this hasn't given him trouble until recently. Denies N/V/D/Fevers/chills. RR even/unlabored. A&Ox4. No other c/c. Hx DM.

## 2015-09-04 ENCOUNTER — Telehealth: Payer: Self-pay | Admitting: Neurology

## 2015-09-04 NOTE — Telephone Encounter (Signed)
Cody [name] called in regards to PT and said he was in the ER over the weekend and wanted to let Dr Tat know/Dawn CB# [phone]

## 2015-09-04 NOTE — Telephone Encounter (Signed)
FYI

## 2015-09-18 ENCOUNTER — Telehealth: Payer: Self-pay | Admitting: Neurology

## 2015-09-18 NOTE — Telephone Encounter (Signed)
Called and spoke with Excell Seltzerooper.

## 2015-09-18 NOTE — Telephone Encounter (Signed)
-----   Message from [facility] Cantey sent at 09/18/2015  8:50 AM EST ----- Manson Allanooper Smith called to let you know that Mr Unk LightningRierson passed away/Dawn CB# [phone]

## 2015-09-21 DEATH — deceased

## 2015-10-06 NOTE — Telephone Encounter (Signed)
Error

## 2016-01-13 IMAGING — CR DG CHEST 2V
3 series · 3 of 3 positions shown · non-contrast
Comparison: Chest radiograph performed 06/01/2015

CLINICAL DATA: Acute onset of shortness of breath. Parkinson's
disease exacerbation. Initial encounter.

EXAM:
CHEST  2 VIEW

[w chest lat (1 of 2)]
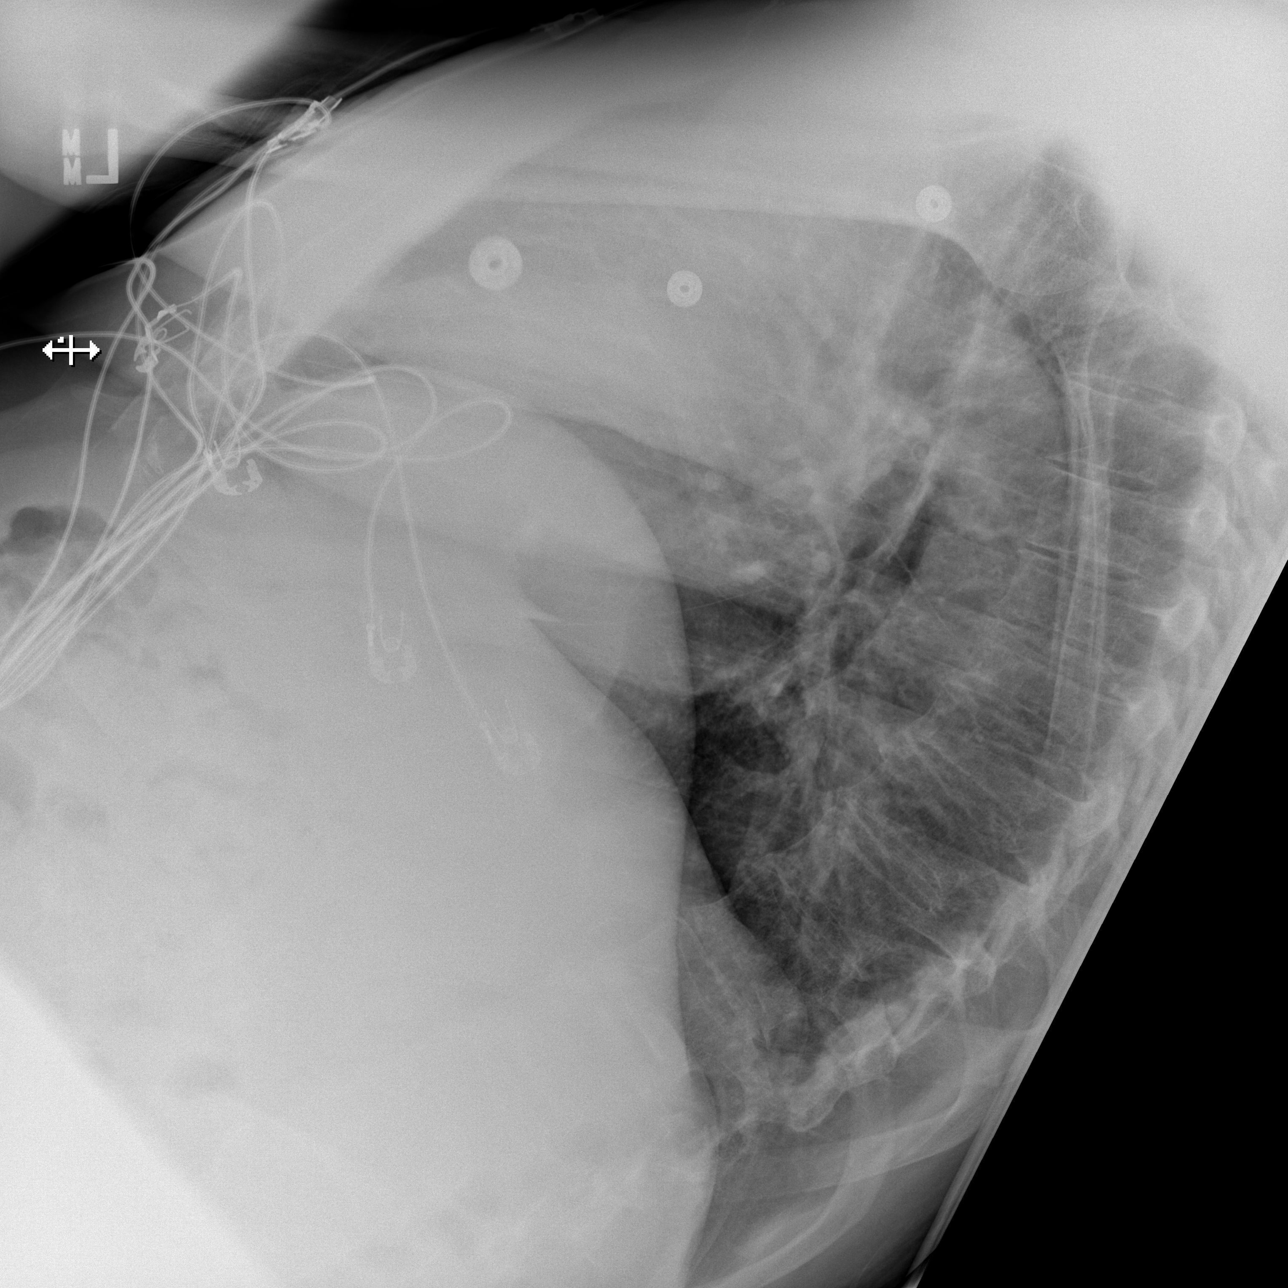

[w chest lat (2 of 2)]
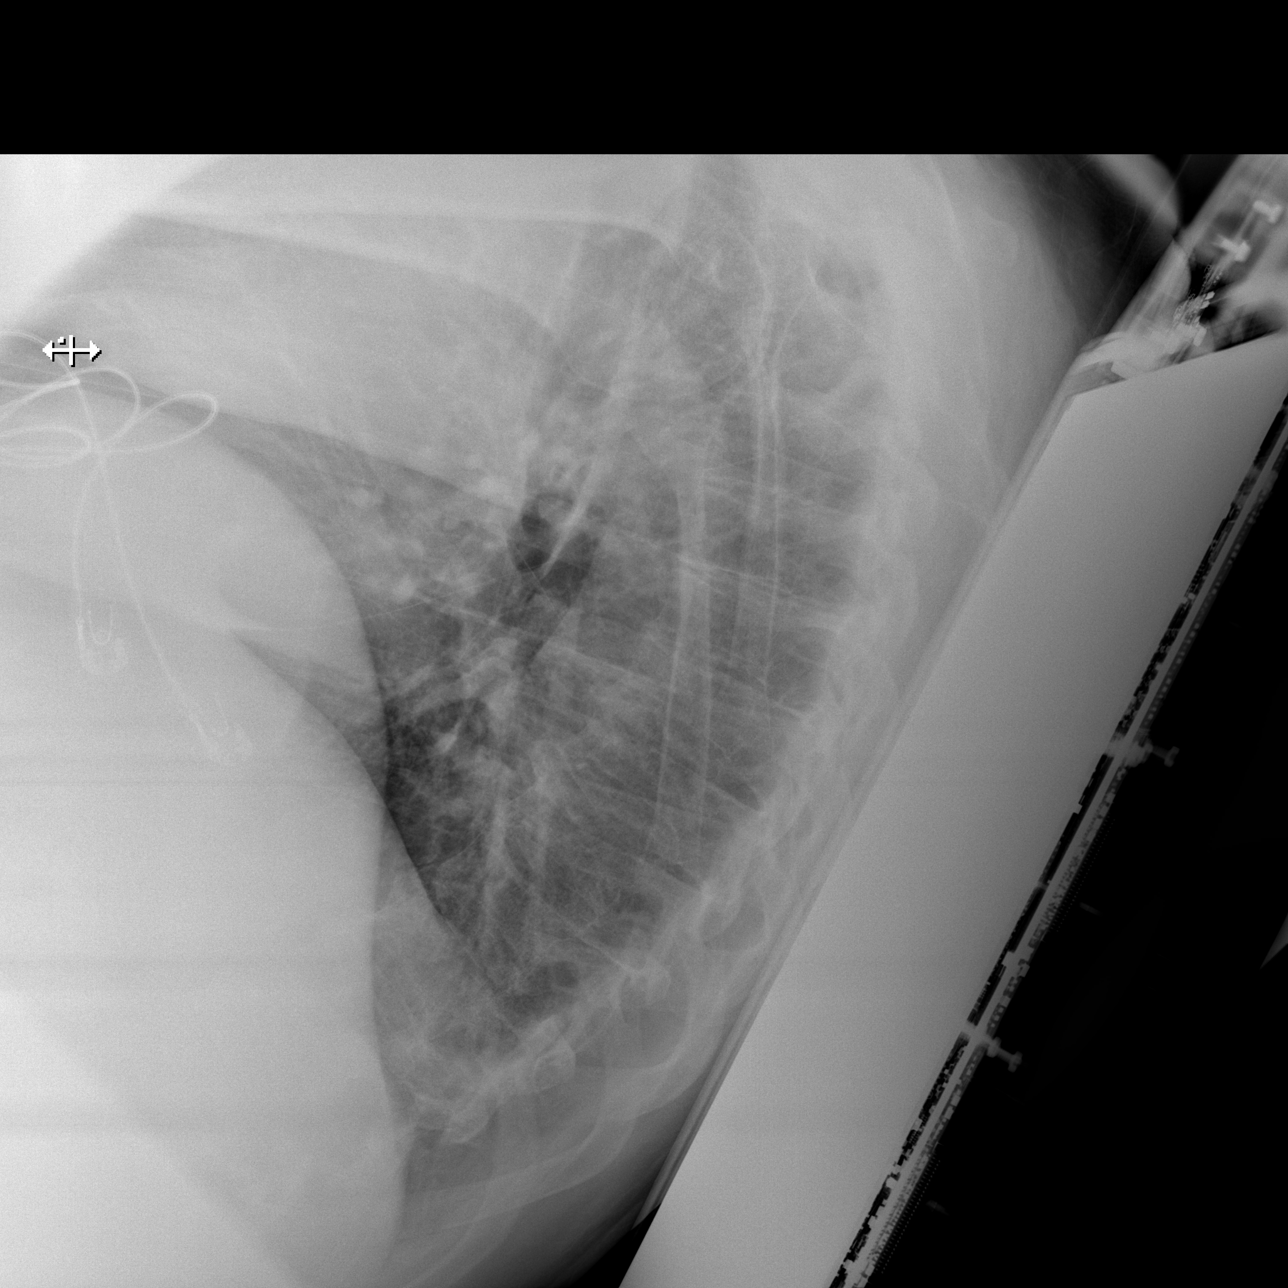

[x chest ap]
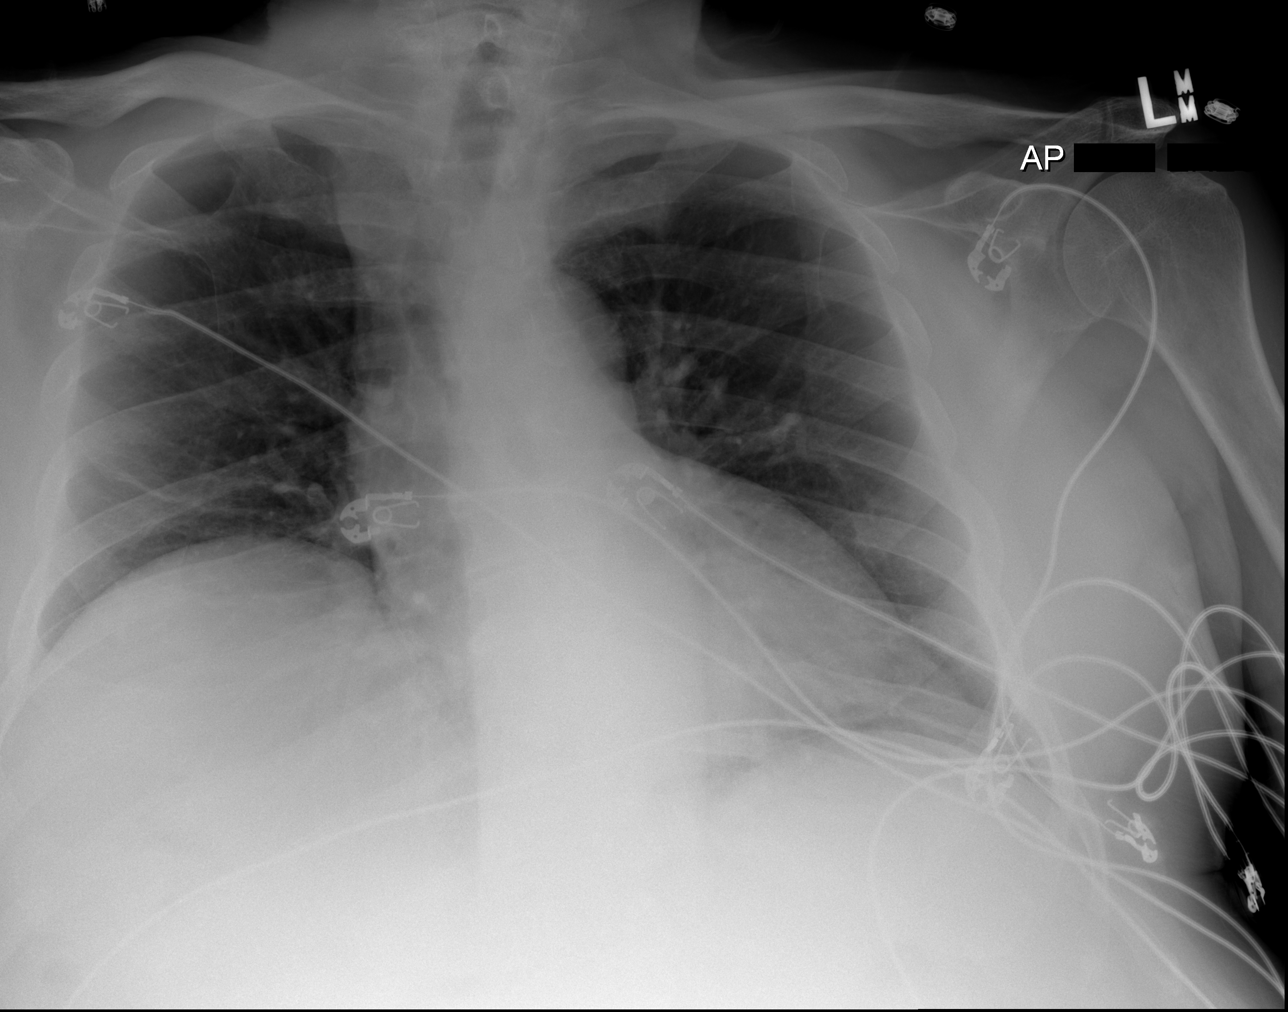

[3 of 3 positions shown; findings below may reference images not displayed]

FINDINGS: There is elevation of the right hemidiaphragm. There is no evidence
of focal opacification, pleural effusion or pneumothorax.

The heart is borderline normal in size. No acute osseous
abnormalities are seen.
IMPRESSION: Elevation of the right hemidiaphragm.  Lungs remain grossly clear.
